# Patient Record
Sex: Female | Born: 1970 | Race: White | Hispanic: No | Marital: Single | Smoking: Never smoker
Health system: Southern US, Community
[De-identification: ages and names within clinical notes are randomized; demographics above are authoritative.]

## PROBLEM LIST (undated history)

## (undated) DIAGNOSIS — M519 Unspecified thoracic, thoracolumbar and lumbosacral intervertebral disc disorder: Secondary | ICD-10-CM

## (undated) DIAGNOSIS — E7800 Pure hypercholesterolemia, unspecified: Secondary | ICD-10-CM

## (undated) DIAGNOSIS — I10 Essential (primary) hypertension: Secondary | ICD-10-CM

## (undated) DIAGNOSIS — R51 Headache: Secondary | ICD-10-CM

## (undated) DIAGNOSIS — R519 Headache, unspecified: Secondary | ICD-10-CM

## (undated) HISTORY — DX: Headache, unspecified: R51.9

## (undated) HISTORY — DX: Unspecified thoracic, thoracolumbar and lumbosacral intervertebral disc disorder: M51.9

## (undated) HISTORY — DX: Essential (primary) hypertension: I10

## (undated) HISTORY — DX: Headache: R51

## (undated) HISTORY — DX: Pure hypercholesterolemia, unspecified: E78.00

---

## 1993-09-10 HISTORY — PX: BACK SURGERY: SHX140

## 2000-09-10 HISTORY — PX: TOTAL ABDOMINAL HYSTERECTOMY: SHX209

## 2000-09-10 HISTORY — PX: BACK SURGERY: SHX140

## 2001-10-12 ENCOUNTER — Encounter: Payer: Self-pay | Admitting: Emergency Medicine

## 2001-10-12 ENCOUNTER — Emergency Department (HOSPITAL_COMMUNITY): Admission: EM | Admit: 2001-10-12 | Discharge: 2001-10-12 | Payer: Self-pay | Admitting: Emergency Medicine

## 2003-09-11 HISTORY — PX: CHOLECYSTECTOMY: SHX55

## 2014-10-11 ENCOUNTER — Other Ambulatory Visit: Payer: Self-pay | Admitting: Neurology

## 2014-10-11 NOTE — Telephone Encounter (Signed)
Unfortunately, [facility], refills must be requested through [facility] per [facility].

## 2015-05-13 ENCOUNTER — Other Ambulatory Visit: Payer: Self-pay | Admitting: Neurology

## 2016-10-17 ENCOUNTER — Encounter: Payer: Self-pay | Admitting: *Deleted

## 2016-10-17 ENCOUNTER — Other Ambulatory Visit: Payer: Self-pay | Admitting: *Deleted

## 2016-10-19 ENCOUNTER — Encounter: Payer: Self-pay | Admitting: Diagnostic Neuroimaging

## 2016-10-19 ENCOUNTER — Ambulatory Visit (INDEPENDENT_AMBULATORY_CARE_PROVIDER_SITE_OTHER): Payer: Medicare HMO | Admitting: Diagnostic Neuroimaging

## 2016-10-19 VITALS — BP 126/81 | HR 76 | Ht 63.0 in | Wt 291.6 lb

## 2016-10-19 DIAGNOSIS — M5136 Other intervertebral disc degeneration, lumbar region: Secondary | ICD-10-CM

## 2016-10-19 DIAGNOSIS — G4700 Insomnia, unspecified: Secondary | ICD-10-CM | POA: Diagnosis not present

## 2016-10-19 DIAGNOSIS — R202 Paresthesia of skin: Secondary | ICD-10-CM | POA: Diagnosis not present

## 2016-10-19 DIAGNOSIS — F329 Major depressive disorder, single episode, unspecified: Secondary | ICD-10-CM | POA: Diagnosis not present

## 2016-10-19 DIAGNOSIS — R200 Anesthesia of skin: Secondary | ICD-10-CM

## 2016-10-19 DIAGNOSIS — Z6841 Body Mass Index (BMI) 40.0 and over, adult: Secondary | ICD-10-CM

## 2016-10-19 DIAGNOSIS — F32A Depression, unspecified: Secondary | ICD-10-CM

## 2016-10-19 DIAGNOSIS — M51369 Other intervertebral disc degeneration, lumbar region without mention of lumbar back pain or lower extremity pain: Secondary | ICD-10-CM

## 2016-10-19 NOTE — Progress Notes (Signed)
[facility]  PATIENT: Felicia King DOB: [birth year]  REFERRING CLINICIAN: Rhae LernerM Philip, MD HISTORY FROM: patient  REASON FOR VISIT: new consult    HISTORICAL  CHIEF COMPLAINT:  Chief Complaint  Patient presents with  . Lumbar disk disease    rm 7, New Pt, fiancee- Darrell, "chronic pain, numbness on left side all the way; both feet; severe headaches; lightening pain in back of legs'; loss of bladder/bowels"    HISTORY OF PRESENT ILLNESS:   46 year old female here for evaluation of left-sided numbness and tingling, back pain, bilateral lower extremity pain, bowel and bladder incontinence, memory loss, depression anxiety.  Patient has long history of chronic low back pain, dating back to 3 surgeries in 1995, 2002, 2002. Patient has seen 2 neurologists in the past for pain management. 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 Patient was advised to return to pay manager clinic recently but she does not want to get back on narcotic pain medications.   In January 2018 patient had new onset of left face, left arm, left leg numbness and tingling. She had similar symptoms in the past which resolved. MRI brain in 2016 for left-sided numbness was negative for demyelinating disease or stroke.  Patient also having intermittent headaches. Patient also having intermittent electrical pulse sensation in her bilateral legs.  Patient having significant problems with obesity and weight management. Patient has tried improving nutrition, without  benefit. Patient has not tried physical therapy or exercise/fitness program. Patient has considered weight loss surgery but has not seen anyone for this yet.  Patient struggling with depression anxiety but has not seen psychiatry or psychology.    REVIEW OF SYSTEMS: Full 14 system review of systems performed and negative with exception of: Insomnia snoring restless legs memory loss confusion dizziness difficulty  swallowing depression anxiety not asleep joint pain cramps aching muscles allergies urination problems Ing in ears swelling in legs chills weight gain blurred vision eye pain easy bruising feeling hot feeling cold.  ALLERGIES: Allergies  Allergen Reactions  . Morphine And Related Itching  . Sulfa Antibiotics Anaphylaxis    HOME MEDICATIONS: Outpatient Medications Prior to Visit  Medication Sig Dispense Refill  . aspirin EC 81 MG tablet Take 81 mg by mouth daily.    Marland Kitchen. atorvastatin (LIPITOR) 40 MG tablet Take 40 mg by mouth daily.    . cholecalciferol (VITAMIN D) 1000 units tablet Take 1,000 Units by mouth daily.    . DULoxetine (CYMBALTA) 60 MG capsule 60 mg daily.    Marland Kitchen. estradiol (ESTRACE) 1 MG tablet Take 1 mg by mouth daily.    . fluticasone (FLONASE) 50 MCG/ACT nasal spray Place into the nose.    . gabapentin (NEURONTIN) 300 MG capsule Take 300 mg by mouth 3 (three) times daily.    Marland Kitchen. glucosamine-chondroitin 500-400 MG tablet Take by mouth daily.    Marland Kitchen. levothyroxine (SYNTHROID, LEVOTHROID) 200 MCG tablet Take 200 mcg by mouth daily.    . metoprolol succinate (TOPROL-XL) 50 MG 24 hr tablet 50 mg daily.    . mirabegron ER (MYRBETRIQ) 50 MG TB24 tablet Take 50 mg by mouth daily.    . Multiple Vitamin (THERA) TABS Take by mouth.    Marland Kitchen. omeprazole (PRILOSEC) 20 MG capsule Take 20 mg by mouth at bedtime.    . pramipexole (MIRAPEX) 1.5 MG tablet 1.5 mg. Take 1/2 tab twice daily    . vitamin C (ASCORBIC ACID) 500 MG tablet Take 500 mg by mouth  daily.     No facility-administered medications prior to visit.     PAST MEDICAL HISTORY: Past Medical History:  Diagnosis Date  . Headache    migraine  . Hypercholesteremia   . Hypertension   . Intervertebral disk disease    lumbar    PAST SURGICAL HISTORY: Past Surgical History:  Procedure Laterality Date  . BACK SURGERY  1995   L5, S1, L4- disk herniation  . BACK SURGERY  2002   L4, L5  . CHOLECYSTECTOMY  2005  . TOTAL ABDOMINAL  HYSTERECTOMY  2002    FAMILY HISTORY: History reviewed. No pertinent family history.  SOCIAL HISTORY:  Social History   Social History  . Marital status: Single    Spouse name: N/A  . Number of children: 1  . Years of education: 16   Occupational History  .      NA   Social History Main Topics  . Smoking status: Never Smoker  . Smokeless tobacco: Never Used  . Alcohol use Yes     Comment: "sometimes"  . Drug use: No  . Sexual activity: Not on file   Other Topics Concern  . Not on file   Social History Narrative   Lives with family, fiancee, son   Caffeine- daily, tea 3-4 glasses     PHYSICAL EXAM  GENERAL EXAM/CONSTITUTIONAL: Vitals:  Vitals:   10/19/16 0917  BP: 126/81  Pulse: 76  Weight: 291 lb 9.6 oz (132.3 kg)  Height: 5\' 3"  (1.6 m)     Body mass index is 51.65 kg/m.  Visual Acuity Screening   Right eye Left eye Both eyes  Without correction: 20/30 20/30   With correction:        Patient is in no distress; well developed, nourished and groomed; neck is supple  CARDIOVASCULAR:  Examination of carotid arteries is normal; no carotid bruits  Regular rate and rhythm, no murmurs  Examination of peripheral vascular system by observation and palpation is normal  EYES:  Ophthalmoscopic exam of optic discs and posterior segments is normal; no papilledema or hemorrhages  MUSCULOSKELETAL:  Gait, strength, tone, movements noted in Neurologic exam below  NEUROLOGIC: MENTAL STATUS:  No flowsheet data found.  awake, alert, oriented to person, place and time  recent and remote memory intact  normal attention and concentration  language fluent, comprehension intact, naming intact,   fund of knowledge appropriate  CRANIAL NERVE:   2nd - no papilledema on fundoscopic exam  2nd, 3rd, 4th, 6th - pupils equal and reactive to light, visual fields full to confrontation, extraocular muscles intact, no nystagmus  5th - facial sensation -->  DECR LEFT FACE SENS  7th - facial strength symmetric  8th - hearing intact  9th - palate elevates symmetrically, uvula midline  11th - shoulder shrug symmetric  12th - tongue protrusion midline  MOTOR:   normal bulk and tone, full strength in the BUE, BLE  LEFT FOOT/ANKLE STIFFNESS AND INCREASED TONE; PLANTAR FLEXED; CANNOT DORSIFLEX  SENSORY:   normal and symmetric to light touch, temperature, vibration, EXCEPT:  DECR IN LEFT ARM AND LEFT LEG  COORDINATION:   finger-nose-finger, fine finger movements normal  REFLEXES:   deep tendon reflexes TRACE and symmetric  GAIT/STATION:   narrow based gait; ANTALGIC GAIT    DIAGNOSTIC DATA (LABS, IMAGING, TESTING) - I reviewed patient records, labs, notes, testing and imaging myself where available.  No results found for: WBC, HGB, HCT, MCV, PLT No results found for: NA, K, CL,  CO2, GLUCOSE, BUN, CREATININE, CALCIUM, PROT, ALBUMIN, AST, ALT, ALKPHOS, BILITOT, GFRNONAA, GFRAA No results found for: CHOL, HDL, LDLCALC, LDLDIRECT, TRIG, CHOLHDL No results found for: MVHQ4O No results found for: VITAMINB12 No results found for: TSH   06/18/15 MRI brain  - No acute intracranial abnormality. No evidence of acute infarct. No findings to suggest a demyelinating process.  11/02/15 MRI cervical spine  - Minimal uncovertebral hypertrophy at C5-6 with mild bilateral neural foraminal narrowing. No central canal stenosis.  11/02/15 MRI thoracic spine  - Mild mid thoracic degenerative disc disease between T7-T9 with minimal disc bulge. No canal stenosis or neuroforaminal narrowing. - There is a low T1/high T2 signal nonenhancing lesion demonstrating some vertebral body edema within the T9 vertebral body. The degree of edema would be unusual for neoplastic process and may possibly reflect atypical hemangioma with adjacent type I  Modic endplate change with edema. There are other smaller more typical appearing hemangiomas present. No other  suspicious lesions are present. This finding could be followed up with CT for better characterization. 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  11/02/15 MRI lumbar spine  - Small left paracentral inferiorly migrating disc extrusion at L4-5 compressing the traversing left L5 nerve root - Postoperative changes of laminectomy at L5-S1 with moderate bony left lateral recess stenosis possibly contacting the left S1 nerve root. - Bilateral foraminal stenosis L5-S1  - No conus or cauda equina abnormality  12/07/15 EMG/NCS - No active denervation was seen.There is no evidence of neuropathy.     ASSESSMENT AND PLAN  46 y.o. year old female here with significant chronic pain in lower back and lower extremities, now with new onset left face, left arm, left leg numbness and healing since January 2018. We'll check MRI brain and cervical spine to rule out demyelinating disease or other causes of left face, arm, leg numbness.  Regarding patient's lower back pain and bilateral lower extremity problems these are most likely complications of her degenerative lumbar spine disease and obesity.   Ddx left hemi-body numbness: demyelinating disease, stroke, vascular, inflammation, cervical spine disease  Ddx of low back pain and leg pain: complications of obesity (pain, neuropathy, cognitive dysfunction), lumbar radiculopathy,   1. Numbness and tingling of left side of face   2. Left arm numbness   3. Left leg numbness   4. Lumbar degenerative disc disease   5. Depression, unspecified depression type   6. Insomnia, unspecified type   7. BMI 50.0-59.9, adult (HCC)     PLAN: - check MRI brain and cervical spine - conservative mgmt of lumbar spine disease (weight loss, exercise, physical therapy, pain mgmt clinic) - consider psychiatry/psychology evaluation for depression/anxiety - consider weight loss clinic evaluation - consider sleep apnea  evaluation  Orders Placed This Encounter  Procedures  . MR BRAIN W WO CONTRAST  . MR CERVICAL SPINE W WO CONTRAST   Return in about 3 months (around 01/16/2017).    Suanne Marker, MD 10/19/2016, 9:57 AM Certified in Neurology, Neurophysiology and Neuroimaging  [facility] [address], [address] [place], [place] [address] [phone]

## 2016-10-19 NOTE — Patient Instructions (Signed)
Thank you for coming to see [place] at [facility]. I hope we have been able to provide you high quality care today.  You may receive a patient satisfaction survey over the next few weeks. We would appreciate your feedback and comments so that we may continue to improve ourselves and the health of our patients.    - check MRI brain and cervical spine  - conservative mgmt of lumbar spine disease (weight loss, exercise, physical therapy, pain mgmt clinic)  - consider psychiatry/psychology evaluation for depression/anxiety  - consider weight loss clinic evaluation  - consider sleep apnea evaluation   ~~~~~~~~~~~~~~~~~~~~~~~~~~~~~~~~~~~~~~~~~~~~~~~~~~~~~~~~~~~~~~~~~  DR. Jaquavious Mercer'S GUIDE TO HAPPY AND HEALTHY LIVING These are some of my general health and wellness recommendations. Some of them may apply to you better than others. Please use common sense as you try these suggestions and feel free to ask me any questions.   ACTIVITY/FITNESS Mental, social, emotional and physical stimulation are very important for brain and body health. Try learning a new activity (arts, music, language, sports, games).  Keep moving your body to the best of your abilities. You can do this at home, inside or outside, the park, community center, gym or anywhere you like. Consider a physical therapist or personal trainer to get started. Consider the app Sworkit. Fitness trackers such as smart-watches, smart-phones or Fitbits can help as well.   NUTRITION Eat more plants: colorful vegetables, nuts, seeds and berries.  Eat less sugar, salt, preservatives and processed foods.  Avoid toxins such as cigarettes and alcohol.  Drink water when you are thirsty. Warm water with a slice of lemon is an excellent morning drink to start the day.  Consider these websites for more information [employer] (https://www.henry-hernandez.biz/) [employer]  (WindowBlog.ch)   RELAXATION Consider practicing mindfulness meditation or other relaxation techniques such as deep breathing, prayer, yoga, tai chi, massage. See website mindful.org or the apps Headspace or Calm to help get started.   SLEEP Try to get at least 7-8+ hours sleep per day. Regular exercise and reduced caffeine will help you sleep better. Practice good sleep hygeine techniques. See website sleep.org for more information.   PLANNING Prepare estate planning, living will, healthcare POA documents. Sometimes this is best planned with the help of an attorney. Theconversationproject.org and agingwithdignity.org are excellent resources.

## 2017-01-28 ENCOUNTER — Emergency Department (HOSPITAL_COMMUNITY): Payer: Medicare HMO

## 2017-01-28 ENCOUNTER — Encounter (HOSPITAL_COMMUNITY): Payer: Self-pay

## 2017-01-28 ENCOUNTER — Inpatient Hospital Stay (HOSPITAL_COMMUNITY)
Admission: EM | Admit: 2017-01-28 | Discharge: 2017-02-01 | DRG: 176 | Disposition: A | Discharge status: Home / Self Care | Payer: Medicare HMO | Attending: Internal Medicine | Admitting: Internal Medicine

## 2017-01-28 DIAGNOSIS — I10 Essential (primary) hypertension: Secondary | ICD-10-CM | POA: Diagnosis present

## 2017-01-28 DIAGNOSIS — Z7982 Long term (current) use of aspirin: Secondary | ICD-10-CM

## 2017-01-28 DIAGNOSIS — D638 Anemia in other chronic diseases classified elsewhere: Secondary | ICD-10-CM | POA: Diagnosis present

## 2017-01-28 DIAGNOSIS — E032 Hypothyroidism due to medicaments and other exogenous substances: Secondary | ICD-10-CM | POA: Diagnosis not present

## 2017-01-28 DIAGNOSIS — Z882 Allergy status to sulfonamides status: Secondary | ICD-10-CM | POA: Diagnosis not present

## 2017-01-28 DIAGNOSIS — Z7901 Long term (current) use of anticoagulants: Secondary | ICD-10-CM | POA: Diagnosis not present

## 2017-01-28 DIAGNOSIS — I2602 Saddle embolus of pulmonary artery with acute cor pulmonale: Secondary | ICD-10-CM | POA: Diagnosis not present

## 2017-01-28 DIAGNOSIS — E039 Hypothyroidism, unspecified: Secondary | ICD-10-CM | POA: Diagnosis present

## 2017-01-28 DIAGNOSIS — Z9109 Other allergy status, other than to drugs and biological substances: Secondary | ICD-10-CM

## 2017-01-28 DIAGNOSIS — I2699 Other pulmonary embolism without acute cor pulmonale: Secondary | ICD-10-CM | POA: Diagnosis present

## 2017-01-28 DIAGNOSIS — I2601 Septic pulmonary embolism with acute cor pulmonale: Secondary | ICD-10-CM | POA: Diagnosis not present

## 2017-01-28 DIAGNOSIS — D72829 Elevated white blood cell count, unspecified: Secondary | ICD-10-CM | POA: Diagnosis present

## 2017-01-28 DIAGNOSIS — R079 Chest pain, unspecified: Secondary | ICD-10-CM

## 2017-01-28 DIAGNOSIS — Z79899 Other long term (current) drug therapy: Secondary | ICD-10-CM

## 2017-01-28 LAB — BASIC METABOLIC PANEL
ANION GAP: 10 (ref 5–15)
BUN: 12 mg/dL (ref 6–20)
CO2: 24 mmol/L (ref 22–32)
Calcium: 9.2 mg/dL (ref 8.9–10.3)
Chloride: 104 mmol/L (ref 101–111)
Creatinine, Ser: 0.95 mg/dL (ref 0.44–1.00)
GFR calc Af Amer: >60 mL/min (ref >60)
Glucose, Bld: 105 mg/dL — ABNORMAL HIGH (ref 65–99)
POTASSIUM: 3.8 mmol/L (ref 3.5–5.1)
SODIUM: 138 mmol/L (ref 135–145)

## 2017-01-28 LAB — CBC
HEMATOCRIT: 39.5 % (ref 36.0–46.0)
HEMOGLOBIN: 12.4 g/dL (ref 12.0–15.0)
MCH: 26.7 pg (ref 26.0–34.0)
MCHC: 31.4 g/dL (ref 30.0–36.0)
MCV: 85.1 fL (ref 78.0–100.0)
PLATELETS: 289 10*3/uL (ref 150–400)
RBC: 4.64 MIL/uL (ref 3.87–5.11)
RDW: 16.6 % — ABNORMAL HIGH (ref 11.5–15.5)
WBC: 13.7 10*3/uL — AB (ref 4.0–10.5)

## 2017-01-28 LAB — I-STAT TROPONIN, ED: Troponin i, poc: 0.01 ng/mL (ref 0.00–0.08)

## 2017-01-28 LAB — BRAIN NATRIURETIC PEPTIDE: B NATRIURETIC PEPTIDE 5: 52.9 pg/mL (ref 0.0–100.0)

## 2017-01-28 LAB — MRSA PCR SCREENING: MRSA BY PCR: NEGATIVE

## 2017-01-28 MED ORDER — ACETAMINOPHEN 650 MG RE SUPP: 650.0000 mg | Freq: Four times a day (QID) | RECTAL | Status: DC | PRN

## 2017-01-28 MED ORDER — NITROGLYCERIN 0.4 MG SL SUBL
0.4000 mg | SUBLINGUAL_TABLET | SUBLINGUAL | Status: DC | PRN
  Administered 2017-01-28 (×3): 0.4 mg via SUBLINGUAL
  Filled 2017-01-28: qty 1

## 2017-01-28 MED ORDER — BACLOFEN 20 MG PO TABS
20.0000 mg | ORAL_TABLET | Freq: Three times a day (TID) | ORAL | Status: DC | PRN
  Administered 2017-01-29 – 2017-01-30 (×3): 20 mg via ORAL
  Filled 2017-01-28 (×5): qty 1

## 2017-01-28 MED ORDER — FENTANYL CITRATE (PF) 100 MCG/2ML IJ SOLN
50.0000 ug | Freq: Once | INTRAMUSCULAR | Status: AC
  Administered 2017-01-28: 50 ug via INTRAVENOUS
  Filled 2017-01-28: qty 2

## 2017-01-28 MED ORDER — PRAMIPEXOLE DIHYDROCHLORIDE 0.25 MG PO TABS: 0.7500 mg | ORAL_TABLET | ORAL | Status: DC

## 2017-01-28 MED ORDER — LEVOTHYROXINE SODIUM 100 MCG PO TABS
200.0000 ug | ORAL_TABLET | Freq: Every day | ORAL | Status: DC
  Administered 2017-01-29 – 2017-02-01 (×4): 200 ug via ORAL
  Filled 2017-01-28 (×4): qty 2

## 2017-01-28 MED ORDER — VITAMIN C 500 MG PO TABS
500.0000 mg | ORAL_TABLET | Freq: Every day | ORAL | Status: DC
  Administered 2017-01-29 – 2017-02-01 (×4): 500 mg via ORAL
  Filled 2017-01-28 (×4): qty 1

## 2017-01-28 MED ORDER — ONDANSETRON HCL 4 MG/2ML IJ SOLN
4.0000 mg | Freq: Four times a day (QID) | INTRAMUSCULAR | Status: DC | PRN
  Administered 2017-01-29 – 2017-01-31 (×5): 4 mg via INTRAVENOUS
  Filled 2017-01-28 (×5): qty 2

## 2017-01-28 MED ORDER — SODIUM CHLORIDE 0.9% FLUSH
3.0000 mL | Freq: Two times a day (BID) | INTRAVENOUS | Status: DC
  Administered 2017-01-29 – 2017-02-01 (×4): 3 mL via INTRAVENOUS

## 2017-01-28 MED ORDER — ONDANSETRON HCL 4 MG PO TABS: 4.0000 mg | ORAL_TABLET | Freq: Four times a day (QID) | ORAL | Status: DC | PRN

## 2017-01-28 MED ORDER — PANTOPRAZOLE SODIUM 40 MG PO TBEC
40.0000 mg | DELAYED_RELEASE_TABLET | Freq: Every evening | ORAL | Status: DC
  Administered 2017-01-28 – 2017-02-01 (×5): 40 mg via ORAL
  Filled 2017-01-28 (×5): qty 1

## 2017-01-28 MED ORDER — HEPARIN (PORCINE) IN NACL 100-0.45 UNIT/ML-% IJ SOLN
1900.0000 [IU]/h | INTRAMUSCULAR | Status: DC
  Administered 2017-01-28: 1500 [IU]/h via INTRAVENOUS
  Administered 2017-01-29: 1800 [IU]/h via INTRAVENOUS
  Administered 2017-01-30 – 2017-01-31 (×2): 1900 [IU]/h via INTRAVENOUS
  Filled 2017-01-28 (×5): qty 250

## 2017-01-28 MED ORDER — IOPAMIDOL (ISOVUE-370) INJECTION 76%
INTRAVENOUS | Status: AC
  Administered 2017-01-28: 100 mL
  Filled 2017-01-28: qty 100

## 2017-01-28 MED ORDER — DULOXETINE HCL 60 MG PO CPEP
60.0000 mg | ORAL_CAPSULE | Freq: Every day | ORAL | Status: DC
  Administered 2017-01-28 – 2017-01-31 (×4): 60 mg via ORAL
  Filled 2017-01-28 (×4): qty 1

## 2017-01-28 MED ORDER — PRAMIPEXOLE DIHYDROCHLORIDE 0.25 MG PO TABS
0.7500 mg | ORAL_TABLET | ORAL | Status: DC
  Administered 2017-01-28 – 2017-01-31 (×7): 0.75 mg via ORAL
  Filled 2017-01-28 (×9): qty 3

## 2017-01-28 MED ORDER — FENTANYL CITRATE (PF) 100 MCG/2ML IJ SOLN
25.0000 ug | INTRAMUSCULAR | Status: DC | PRN
  Administered 2017-01-28: 25 ug via INTRAVENOUS
  Administered 2017-01-29: 50 ug via INTRAVENOUS
  Filled 2017-01-28 (×2): qty 2

## 2017-01-28 MED ORDER — ASPIRIN EC 81 MG PO TBEC
81.0000 mg | DELAYED_RELEASE_TABLET | Freq: Every day | ORAL | Status: DC
  Administered 2017-01-29 – 2017-02-01 (×4): 81 mg via ORAL
  Filled 2017-01-28 (×4): qty 1

## 2017-01-28 MED ORDER — ADULT MULTIVITAMIN W/MINERALS CH
1.0000 | ORAL_TABLET | Freq: Every day | ORAL | Status: DC
  Administered 2017-01-29 – 2017-02-01 (×4): 1 via ORAL
  Filled 2017-01-28 (×4): qty 1

## 2017-01-28 MED ORDER — MORPHINE SULFATE (PF) 4 MG/ML IV SOLN
2.0000 mg | INTRAVENOUS | Status: DC | PRN
  Filled 2017-01-28: qty 1

## 2017-01-28 MED ORDER — HYDROCODONE-ACETAMINOPHEN 5-325 MG PO TABS
1.0000 | ORAL_TABLET | ORAL | Status: DC | PRN
  Administered 2017-01-29 – 2017-01-31 (×9): 2 via ORAL
  Administered 2017-01-31: 1 via ORAL
  Administered 2017-01-31: 2 via ORAL
  Administered 2017-02-01 (×2): 1 via ORAL
  Administered 2017-02-01: 2 via ORAL
  Administered 2017-02-01: 1 via ORAL
  Filled 2017-01-28: qty 2
  Filled 2017-01-28: qty 1
  Filled 2017-01-28 (×3): qty 2
  Filled 2017-01-28: qty 1
  Filled 2017-01-28: qty 2
  Filled 2017-01-28: qty 1
  Filled 2017-01-28 (×3): qty 2
  Filled 2017-01-28: qty 1
  Filled 2017-01-28 (×2): qty 2

## 2017-01-28 MED ORDER — SODIUM CHLORIDE 0.9 % IV SOLN
INTRAVENOUS | Status: AC
  Administered 2017-01-28: 22:00:00 via INTRAVENOUS

## 2017-01-28 MED ORDER — MIRABEGRON ER 50 MG PO TB24
50.0000 mg | ORAL_TABLET | Freq: Every day | ORAL | Status: DC
  Administered 2017-01-28 – 2017-01-31 (×4): 50 mg via ORAL
  Filled 2017-01-28 (×5): qty 1

## 2017-01-28 MED ORDER — DICYCLOMINE HCL 10 MG PO CAPS
10.0000 mg | ORAL_CAPSULE | Freq: Three times a day (TID) | ORAL | Status: DC
  Administered 2017-01-29 – 2017-02-01 (×12): 10 mg via ORAL
  Filled 2017-01-28 (×13): qty 1

## 2017-01-28 MED ORDER — GABAPENTIN 300 MG PO CAPS
300.0000 mg | ORAL_CAPSULE | Freq: Three times a day (TID) | ORAL | Status: DC | PRN
  Administered 2017-01-30: 300 mg via ORAL
  Filled 2017-01-28: qty 1

## 2017-01-28 MED ORDER — ACETAMINOPHEN 325 MG PO TABS: 650.0000 mg | ORAL_TABLET | Freq: Four times a day (QID) | ORAL | Status: DC | PRN

## 2017-01-28 MED ORDER — ATORVASTATIN CALCIUM 40 MG PO TABS
40.0000 mg | ORAL_TABLET | Freq: Every day | ORAL | Status: DC
  Administered 2017-01-28 – 2017-02-01 (×5): 40 mg via ORAL
  Filled 2017-01-28 (×5): qty 1

## 2017-01-28 MED ORDER — VITAMIN D 1000 UNITS PO TABS
1000.0000 [IU] | ORAL_TABLET | Freq: Every day | ORAL | Status: DC
  Administered 2017-01-29 – 2017-02-01 (×4): 1000 [IU] via ORAL
  Filled 2017-01-28 (×5): qty 1

## 2017-01-28 MED ORDER — HYDRALAZINE HCL 20 MG/ML IJ SOLN: 5.0000 mg | INTRAMUSCULAR | Status: DC | PRN

## 2017-01-28 MED ORDER — HEPARIN BOLUS VIA INFUSION
5800.0000 [IU] | Freq: Once | INTRAVENOUS | Status: AC
  Administered 2017-01-28: 5800 [IU] via INTRAVENOUS
  Filled 2017-01-28: qty 5800

## 2017-01-28 MED ORDER — POLYETHYLENE GLYCOL 3350 17 G PO PACK: 17.0000 g | PACK | Freq: Every day | ORAL | Status: DC | PRN

## 2017-01-28 MED ORDER — BUSPIRONE HCL 5 MG PO TABS
5.0000 mg | ORAL_TABLET | Freq: Two times a day (BID) | ORAL | Status: DC
  Administered 2017-01-28 – 2017-02-01 (×8): 5 mg via ORAL
  Filled 2017-01-28 (×8): qty 1

## 2017-01-28 NOTE — Progress Notes (Signed)
ANTICOAGULATION CONSULT NOTE - Initial Consult  Pharmacy Consult for heparin Indication: pulmonary embolus  Allergies  Allergen Reactions  . Morphine And Related Itching  . Sulfa Antibiotics Anaphylaxis    Patient Measurements:   Heparin Dosing Weight: 84 kg  Vital Signs: Temp: 99.3 F (37.4 C) (05/21 1526) Temp Source: Oral (05/21 1526) BP: 129/78 (05/21 1700) Pulse Rate: 87 (05/21 1700)  Labs:  Recent Labs  01/28/17 1702  HGB 12.4  HCT 39.5  PLT 289  CREATININE 0.95    CrCl cannot be calculated (Unknown ideal weight.).   Medical History: Past Medical History:  Diagnosis Date  . Headache    migraine  . Hypercholesteremia   . Hypertension   . Intervertebral disk disease    lumbar    Assessment: 45yo F presents with chest pain, found to have PE. Pharmacy consulted to dose heparin. No PTA anticoagulation, Hgb 12.4, Plt wnl, no bleeding noted  Goal of Therapy:  Heparin level 0.3-0.7 units/ml Monitor platelets by anticoagulation protocol: Yes   Plan:  Give 5800 units bolus x 1 Start heparin infusion at 1500 units/hr Check anti-Xa level in 6 hours and daily while on heparin Continue to monitor H&H and platelets   Mackie Paienee Asmara Backs, PharmD PGY1 Pharmacy Resident Rx ED [id number] 01/28/2017 6:49 PM

## 2017-01-28 NOTE — H&P (Signed)
History and Physical    Felicia King:[id number] DOB: Jun 06, 1971 DOA: 01/28/2017  PCP: Dan Maker, MD   Patient coming from: Home   Chief Complaint: Chest pain  HPI: Felicia King is a 46 y.o. female with medical history significant for hypertension, chronic back pain, and hypothyroidism who presents to the emergency department with severe chest pain for the past 2-3 days. Patient reports that she had been in her usual state of health until she noted the insidious development of chest pain approximately 3 days ago. She describes the pain as severe, sharp, radiating to the left jaw, worse with deep inspiration or cough, and with no alleviating factors identified. There has been dyspnea associated with this, but no significant cough. She denies any associated nausea or diaphoresis. She has never experienced similar symptoms previously. She does not have a known history of CAD, but reports a strong family history of such. She reports that the pain is steadily worsened since time of onset, prompting her to seek evaluation in the ED today. She denies any recent prolonged immobilization, surgery, or long distance travel. She denies tobacco use or hormone therapy. She reports history of a clot in her left arm which was felt to be secondary to an IV.   ED Course: Upon arrival to the ED, patient is found to be afebrile, saturating well on room air, mildly tachypneic, and mildly hypertensive. EKG features a sinus rhythm and chest x-ray is notable for low lung volumes and bibasilar atelectasis. Chemistry panel is unremarkable and CBC features a leukocytosis to 13,700. Troponin is within the normal limits at 0.01 and BNP is normal at 53. CTA study was performed and notable for multifocal lobar PE in bilateral upper and lower lobes with overall clot burden moderate to large, but no evidence for heart strain. Also noted on CTA has multifocal opacities concerning for early developing infarcts. Patient  continued to experience pain in the ED and was treated with fentanyl. She was started on heparin infusion with pharmacy assistance. She remained hemodynamically stable and in mild respiratory distress. She will be admitted to the stepdown unit for ongoing evaluation and management of acute severe chest pain secondary to acute bilateral PE.  Review of Systems:  All other systems reviewed and apart from HPI, are negative.  Past Medical History:  Diagnosis Date  . Headache    migraine  . Hypercholesteremia   . Hypertension   . Intervertebral disk disease    lumbar    Past Surgical History:  Procedure Laterality Date  . BACK SURGERY  1995   L5, S1, L4- disk herniation  . BACK SURGERY  2002   L4, L5  . CHOLECYSTECTOMY  2005  . TOTAL ABDOMINAL HYSTERECTOMY  2002     reports that she has never smoked. She has never used smokeless tobacco. She reports that she drinks alcohol. She reports that she does not use drugs.  Allergies  Allergen Reactions  . Morphine And Related Itching  . Sulfa Antibiotics Anaphylaxis    History reviewed. No pertinent family history.   Prior to Admission medications   Medication Sig Start Date End Date Taking? Authorizing Provider  aspirin EC 81 MG tablet Take 81-162 mg by mouth See admin instructions. Take 1 tablet (81 mg) by mouth every morning, may take 2 tablets (162) if in pain when waking up   Yes [provider]  atorvastatin (LIPITOR) 40 MG tablet Take 40 mg by mouth at bedtime. For high cholesterol 11/15/15  Yes [provider]  baclofen (LIORESAL) 20 MG tablet Take 20 mg by mouth 3 (three) times daily as needed for muscle spasms.   Yes [provider]  busPIRone (BUSPAR) 5 MG tablet Take 5 mg by mouth 2 (two) times daily. For anxiety 10/17/16  Yes [provider]  cholecalciferol (VITAMIN D) 1000 units tablet Take 1,000 Units by mouth daily. 06/16/15  Yes [provider]  Diclofenac Sodium 1.5 % SOLN Place  10 drops onto the skin 2 (two) times daily as needed (neck, shoulders and chest pain).   Yes [provider]  dicyclomine (BENTYL) 10 MG capsule Take 10 mg by mouth 3 (three) times daily before meals.   Yes [provider]  DULoxetine (CYMBALTA) 60 MG capsule Take 60 mg by mouth at bedtime. For depression 07/09/16  Yes [provider]  fluticasone (FLONASE) 50 MCG/ACT nasal spray Place 1 spray into both nostrils at bedtime as needed for allergies or rhinitis.  08/23/16  Yes [provider]  gabapentin (NEURONTIN) 300 MG capsule Take 300 mg by mouth 3 (three) times daily as needed (nerve pain).  09/07/16 09/07/17 Yes [provider]  levothyroxine (SYNTHROID, LEVOTHROID) 200 MCG tablet Take 200 mcg by mouth daily. 09/21/16 09/21/17 Yes [provider]  metoprolol succinate (TOPROL-XL) 50 MG 24 hr tablet Take 50 mg by mouth daily. For high blood pressure 09/21/16  Yes [provider]  mirabegron ER (MYRBETRIQ) 50 MG TB24 tablet Take 50 mg by mouth at bedtime. For bladder incontinence   Yes [provider]  Multiple Vitamin (MULTIVITAMIN WITH MINERALS) TABS tablet Take 1 tablet by mouth daily. Daily Women's Energy Vitamin   Yes [provider]  omeprazole (PRILOSEC) 20 MG capsule Take 20 mg by mouth at bedtime. For stomach acid 11/15/15  Yes [provider]  pramipexole (MIRAPEX) 1.5 MG tablet Take 0.75 mg by mouth See admin instructions. Take 1/2 tablet (0.75 mg) by mouth every evening (4p-5p) and at bedtime - for restless legs 03/26/16  Yes [provider]  SUMAtriptan (IMITREX) 50 MG tablet Take 50 mg by mouth every 2 (two) hours as needed for migraine. May repeat in 2 hours if headache persists or recurs - max 3 tablets per week   Yes [provider]  vitamin C (ASCORBIC ACID) 500 MG tablet Take 500 mg by mouth daily.   Yes [provider]    Physical Exam: Vitals:   01/28/17 1800 01/28/17  1830 01/28/17 1900 01/28/17 1930  BP: (!) 150/80 (!) 146/85 (!) 149/89 (!) 147/92  Pulse: 93 93 91 91  Resp: (!) 25 (!) 25 (!) 28 (!) 26  Temp:      TempSrc:      SpO2: 92% 92% 99% 100%  Weight: 133.8 kg (295 lb)     Height: 5\' 2"  (1.575 m)         Constitutional: Mild tachypnea, obvious discomfort. No pallor or diaphoresis.  Eyes: PERTLA, lids and conjunctivae normal ENMT: Mucous membranes are moist. Posterior pharynx clear of any exudate or lesions.   Neck: normal, supple, no masses, no thyromegaly Respiratory: clear to auscultation bilaterally, no wheezing, no crackles. Mild tachypnea.  Cardiovascular: S1 & S2 heard, regular rate and rhythm, no significant murmur. No significant JVD. Abdomen: No distension, no tenderness, no masses palpated. Bowel sounds normal.  Musculoskeletal: no clubbing / cyanosis. No joint deformity upper and lower extremities.  Skin: no significant rashes, lesions, ulcers. Warm, dry, well-perfused. Neurologic: CN 2-12 grossly intact.  Sensation intact, DTR normal. Strength 5/5 in all 4 limbs.  Psychiatric: Alert and oriented x 3. Cooperative.     Labs on Admission: I have personally reviewed following labs and imaging studies  CBC:  Recent Labs Lab 01/28/17 1702  WBC 13.7*  HGB 12.4  HCT 39.5  MCV 85.1  PLT 289   Basic Metabolic Panel:  Recent Labs Lab 01/28/17 1702  NA 138  K 3.8  CL 104  CO2 24  GLUCOSE 105*  BUN 12  CREATININE 0.95  CALCIUM 9.2   GFR: Estimated Creatinine Clearance: 98.7 mL/min (by C-G formula based on SCr of 0.95 mg/dL). Liver Function Tests: No results for input(s): AST, ALT, ALKPHOS, BILITOT, PROT, ALBUMIN in the last 168 hours. No results for input(s): LIPASE, AMYLASE in the last 168 hours. No results for input(s): AMMONIA in the last 168 hours. Coagulation Profile: No results for input(s): INR, PROTIME in the last 168 hours. Cardiac Enzymes: No results for input(s): CKTOTAL, CKMB, CKMBINDEX, TROPONINI in  the last 168 hours. BNP (last 3 results) No results for input(s): PROBNP in the last 8760 hours. HbA1C: No results for input(s): HGBA1C in the last 72 hours. CBG: No results for input(s): GLUCAP in the last 168 hours. Lipid Profile: No results for input(s): CHOL, HDL, LDLCALC, TRIG, CHOLHDL, LDLDIRECT in the last 72 hours. Thyroid Function Tests: No results for input(s): TSH, T4TOTAL, FREET4, T3FREE, THYROIDAB in the last 72 hours. Anemia Panel: No results for input(s): VITAMINB12, FOLATE, FERRITIN, TIBC, IRON, RETICCTPCT in the last 72 hours. Urine analysis: No results found for: COLORURINE, APPEARANCEUR, LABSPEC, PHURINE, GLUCOSEU, HGBUR, BILIRUBINUR, KETONESUR, PROTEINUR, UROBILINOGEN, NITRITE, LEUKOCYTESUR Sepsis Labs: @LABRCNTIP (procalcitonin:4,lacticidven:4) )No results found for this or any previous visit (from the past 240 hour(s)).   Radiological Exams on Admission: Dg Chest 2 View  Result Date: 01/28/2017 CLINICAL DATA:  Nausea, vomiting, and diarrhea for 1 week, LEFT side chest pain for 2 days, LEFT neck pain and head pain, worsening chest pain worsened when lying flat, history hypertension EXAM: CHEST  2 VIEW COMPARISON:  04/06/2006 FINDINGS: Borderline enlargement of cardiac silhouette. Mediastinal contours and pulmonary vascularity normal. Decreased lung volumes with bibasilar atelectasis. Lungs otherwise clear. No pleural effusion or pneumothorax. Scattered degenerative disc disease changes of the thoracic spine. IMPRESSION: Low lung volumes with bibasilar atelectasis. Electronically Signed   By: Ulyses SouthwardMark  Boles M.D.   On: 01/28/2017 16:32   Ct Angio Chest Pe W Or Wo Contrast  Result Date: 01/28/2017 CLINICAL DATA:  Chest pain, shortness of breath x2 days EXAM: CT ANGIOGRAPHY CHEST WITH CONTRAST TECHNIQUE: Multidetector CT imaging of the chest was performed using the standard protocol during bolus administration of intravenous contrast. Multiplanar CT image reconstructions and  MIPs were obtained to evaluate the vascular anatomy. CONTRAST:  100 mL Isovue 370 IV COMPARISON:  Chest radiographs dated 01/28/2017 FINDINGS: Cardiovascular: Satisfactory opacification of the bilateral pulmonary arteries to the segmental level. Multiple lobar pulmonary emboli in the distal right main pulmonary artery (series 5/ image 87), extending into the upper and lower lobes, as well as the bifurcation of the left upper and lower pulmonary arteries (series 5/ image 45), and extending into the bilateral lower lobes (series 5/ images 59-60). Overall clot burden is moderate to large. RV to LV ratio 0.97.  No evidence of right heart strain. The heart is normal in size.  No pericardial effusion. No evidence of thoracic aortic aneurysm or dissection. Mediastinum/Nodes: No suspicious mediastinal lymphadenopathy. Lungs/Pleura: Multifocal subpleural patchy opacities in the posterior right middle  lobe (series 7/image 67, lingula (series 7/image 32), and left lower lobe (series 7/image 36). In this clinical setting, this appearance is worrisome for early/ developing pulmonary infarcts. Additional linear scarring/ atelectasis in the left upper lobe. Mild patchy left lower lobe opacity, likely atelectasis. Trace left pleural effusion. No pneumothorax. Upper Abdomen: Visualized upper abdomen is notable for cholecystectomy clips. Musculoskeletal: Degenerative changes of the visualized thoracolumbar spine. Review of the MIP images confirms the above findings. IMPRESSION: Multifocal lobar pulmonary emboli in the bilateral upper and lower lobes, as described above. Overall clot burden is moderate to large. No evidence of right heart strain. Multifocal patchy subpleural opacities in the right middle lobe, lingula and bilateral lower lobes, worrisome for early developing pulmonary infarcts. Trace left pleural effusion. Critical Value/emergent results were called by telephone at the time of interpretation on 01/28/2017 at 5:56 pm to  Dr. Virgina Norfolk, who verbally acknowledged these results. Electronically Signed   By: Charline Bills M.D.   On: 01/28/2017 17:58    EKG: Independently reviewed. Sinus rhythm, wandering baseline.  Assessment/Plan  1. Acute pulmonary embolism - Pt presents with 3 days of chest pain and SOB  - CTA chest reveals acute PE in bilateral upper and lower lobes, mod-large overall clot burden, but no CT-evidence for heart-strain  - Initial troponin is 0.01; she has remained hemodynamically stable; no hypoxia  - She was started on heparin infusion in ED  - She has hx of UE clot precipitated by IV; denies FHx of VTE, non-smoker, not on OCP or hormone therapy; no recent immobilization  - Plan to monitor on telemetry, continue heparin infusion, obtain serial troponin measurements, check echocardiogram, and venous US bilateral LE's    2. Hypertension  - BP mildly elevated in ED; pain likely contributing  - Managed at home with Toprol; will hold for now given acute PE and manage with prn hydralazine IVP's    3. Hypothyroidism  - Appears stable, continue Synthroid    4. Leukocytosis - WBC 13,700 on admission - No fever or apparent focus of infection  - Likely reactive to the acute PE  - Culture if febrile    DVT prophylaxis: heparin infusion Code Status: Full  Family Communication: Discussed with patient Disposition Plan: Admit to SDU Consults called: None Admission status: Inpatient    Briscoe Deutscher, MD [facility] Pager [phone]  If 7PM-7AM, please contact night-coverage [email or web address] Password [facility]  01/28/2017, 8:36 PM

## 2017-01-28 NOTE — ED Notes (Signed)
Patient taken to XRAY

## 2017-01-28 NOTE — ED Notes (Signed)
All heparin administration verified with Vergia Alconasey H. RN at bedside

## 2017-01-28 NOTE — ED Notes (Signed)
Patient taken to CT.

## 2017-01-28 NOTE — ED Notes (Signed)
Patient here for uncontrollable left sided chest and back pain.  Hurts more with palpation.  Has family hx of cardiac disease.  Troponin was negative.  Is being admitted for acute pulmonary embolism.  CT showed acute bilateral multifocal PE.  Patient has heparin running and is on 2L nasal cannula with O2 saturation at 98%.  Patient is A&Ox4 at this time and has pain with any movement.

## 2017-01-28 NOTE — ED Notes (Signed)
Report attempted x 1

## 2017-01-28 NOTE — ED Provider Notes (Signed)
[facility]-EMERGENCY DEPT Provider Note   CSN: [id number] Arrival date & time: 01/28/17  1515     History   Chief Complaint Chief Complaint  Patient presents with  . Chest Pain    HPI Felicia King is a 46 y.o. female.  The history is provided by the patient.  Chest Pain   This is a new problem. The current episode started 2 days ago. The problem occurs daily. The problem has been gradually worsening. The pain is associated with rest. The pain is present in the substernal region. The pain is at a severity of 9/10. The pain is severe. The quality of the pain is described as heavy, sharp and stabbing. The pain radiates to the upper back and left jaw. Associated symptoms include lower extremity edema, nausea and shortness of breath. Pertinent negatives include no abdominal pain, no back pain, no claudication, no cough, no diaphoresis, no dizziness, no exertional chest pressure, no fever, no headaches, no hemoptysis, no irregular heartbeat, no leg pain, no malaise/fatigue, no near-syncope, no numbness, no orthopnea, no palpitations, no syncope, no vomiting and no weakness. She has tried nothing for the symptoms. The treatment provided no relief. Risk factors include hormone replacement therapy and obesity.  Her past medical history is significant for hyperlipidemia and hypertension.  Pertinent negatives for past medical history include no seizures.  Her family medical history is significant for CAD, heart disease, hyperlipidemia and hypertension.    Past Medical History:  Diagnosis Date  . Headache    migraine  . Hypercholesteremia   . Hypertension   . Intervertebral disk disease    lumbar    Patient Active Problem List   Diagnosis Date Noted  . Essential hypertension 01/28/2017  . Hypothyroidism 01/28/2017  . Acute pulmonary embolism (HCC) 01/28/2017    Past Surgical History:  Procedure Laterality Date  . BACK SURGERY  1995   L5, S1, L4- disk herniation  . BACK SURGERY  2002   L4, L5  . CHOLECYSTECTOMY  2005  . TOTAL ABDOMINAL HYSTERECTOMY  2002    OB History    No data available       Home Medications    Prior to Admission medications   Medication Sig Start Date End Date Taking? Authorizing Provider  aspirin EC 81 MG tablet Take 81-162 mg by mouth See admin instructions. Take 1 tablet (81 mg) by mouth every morning, may take 2 tablets (162) if in pain when waking up   Yes [provider]  atorvastatin (LIPITOR) 40 MG tablet Take 40 mg by mouth at bedtime. For high cholesterol 11/15/15  Yes [provider]  baclofen (LIORESAL) 20 MG tablet Take 20 mg by mouth 3 (three) times daily as needed for muscle spasms.   Yes [provider]  busPIRone (BUSPAR) 5 MG tablet Take 5 mg by mouth 2 (two) times daily. For anxiety 10/17/16  Yes [provider]  cholecalciferol (VITAMIN D) 1000 units tablet Take 1,000 Units by mouth daily. 06/16/15  Yes [provider]  Diclofenac Sodium 1.5 % SOLN Place 10 drops onto the skin 2 (two) times daily as needed (neck, shoulders and chest pain).   Yes [provider]  dicyclomine (BENTYL) 10 MG capsule Take 10 mg by mouth 3 (three) times daily before meals.   Yes [provider]  DULoxetine (CYMBALTA) 60 MG capsule Take 60 mg by mouth at bedtime. For depression 07/09/16  Yes [provider]  fluticasone (FLONASE) 50 MCG/ACT nasal spray Place 1  spray into both nostrils at bedtime as needed for allergies or rhinitis.  08/23/16  Yes [provider]  gabapentin (NEURONTIN) 300 MG capsule Take 300 mg by mouth 3 (three) times daily as needed (nerve pain).  09/07/16 09/07/17 Yes [provider]  levothyroxine (SYNTHROID, LEVOTHROID) 200 MCG tablet Take 200 mcg by mouth daily. 09/21/16 09/21/17 Yes [provider]  metoprolol succinate (TOPROL-XL) 50 MG 24 hr tablet Take 50 mg by mouth daily. For high blood pressure 09/21/16  Yes [provider]    mirabegron ER (MYRBETRIQ) 50 MG TB24 tablet Take 50 mg by mouth at bedtime. For bladder incontinence   Yes [provider]  Multiple Vitamin (MULTIVITAMIN WITH MINERALS) TABS tablet Take 1 tablet by mouth daily. Daily Women's Energy Vitamin   Yes [provider]  omeprazole (PRILOSEC) 20 MG capsule Take 20 mg by mouth at bedtime. For stomach acid 11/15/15  Yes [provider]  pramipexole (MIRAPEX) 1.5 MG tablet Take 0.75 mg by mouth See admin instructions. Take 1/2 tablet (0.75 mg) by mouth every evening (4p-5p) and at bedtime - for restless legs 03/26/16  Yes [provider]  SUMAtriptan (IMITREX) 50 MG tablet Take 50 mg by mouth every 2 (two) hours as needed for migraine. May repeat in 2 hours if headache persists or recurs - max 3 tablets per week   Yes [provider]  vitamin C (ASCORBIC ACID) 500 MG tablet Take 500 mg by mouth daily.   Yes [provider]    Family History History reviewed. No pertinent family history.  Social History Social History  Substance Use Topics  . Smoking status: Never Smoker  . Smokeless tobacco: Never Used  . Alcohol use Yes     Comment: "sometimes"     Allergies   Morphine and related and Sulfa antibiotics   Review of Systems Review of Systems  Constitutional: Negative for chills, diaphoresis, fever and malaise/fatigue.  HENT: Negative for ear pain and sore throat.   Eyes: Negative for pain and visual disturbance.  Respiratory: Positive for shortness of breath. Negative for cough and hemoptysis.   Cardiovascular: Positive for chest pain. Negative for palpitations, orthopnea, claudication, syncope and near-syncope.  Gastrointestinal: Positive for nausea. Negative for abdominal pain and vomiting.  Genitourinary: Negative for dysuria and hematuria.  Musculoskeletal: Negative for arthralgias and back pain.  Skin: Negative for color change and rash.  Neurological: Negative for dizziness, seizures,  syncope, weakness, numbness and headaches.  All other systems reviewed and are negative.    Physical Exam Updated Vital Signs  ED Triage Vitals  Enc Vitals Group     BP 01/28/17 1526 (!) 146/89     Pulse Rate 01/28/17 1526 87     Resp 01/28/17 1526 (!) 26     Temp 01/28/17 1526 99.3 F (37.4 C)     Temp Source 01/28/17 1526 Oral     SpO2 01/28/17 1526 96 %     Weight --      Height --      Head Circumference --      Peak Flow --      Pain Score 01/28/17 1525 10     Pain Loc --      Pain Edu? --      Excl. in GC? --     Physical Exam  Constitutional: She is oriented to person, place, and time. She appears well-developed. She appears distressed.  HENT:  Head: Normocephalic and atraumatic.  Eyes: Conjunctivae and EOM  are normal. Pupils are equal, round, and reactive to light.  Neck: Normal range of motion. Neck supple.  Cardiovascular: Normal rate, regular rhythm, normal heart sounds and intact distal pulses.   No murmur heard. Pulmonary/Chest: Effort normal and breath sounds normal. No respiratory distress. She has no wheezes. She has no rales.  Abdominal: Soft. Bowel sounds are normal. She exhibits distension. There is no tenderness.  Musculoskeletal: Normal range of motion. She exhibits edema (b/l LE).  Neurological: She is alert and oriented to person, place, and time.  Skin: Skin is warm and dry. Capillary refill takes less than 2 seconds.  Psychiatric: She has a normal mood and affect.  Nursing note and vitals reviewed.    ED Treatments / Results  Labs (all labs ordered are listed, but only abnormal results are displayed) Labs Reviewed  BASIC METABOLIC PANEL - Abnormal; Notable for the following:       Result Value   Glucose, Bld 105 (*)    All other components within normal limits  CBC - Abnormal; Notable for the following:    WBC 13.7 (*)    RDW 16.6 (*)    All other components within normal limits  MRSA PCR SCREENING  BRAIN NATRIURETIC PEPTIDE  HEPARIN  LEVEL (UNFRACTIONATED)  HEPARIN LEVEL (UNFRACTIONATED)  CBC  HIV ANTIBODY (ROUTINE TESTING)  BASIC METABOLIC PANEL  TROPONIN I  TROPONIN I  TROPONIN I  I-STAT TROPOININ, ED    EKG  EKG Interpretation  Date/Time:  Monday Jan 28 2017 15:25:41 EDT Ventricular Rate:  86 PR Interval:    QRS Duration: 90 QT Interval:  371 QTC Calculation: 444 R Axis:   25 Text Interpretation:  Sinus rhythm Baseline wander in lead(s) I II III aVR aVL aVF V2 V3 V4 V5 V6  Q wave in lead 3.  No prior ECG for comparison.  No STEMI Confirmed by Theda Belfast ([id number]) on 01/28/2017 3:37:08 PM       Radiology Dg Chest 2 View  Result Date: 01/28/2017 CLINICAL DATA:  Nausea, vomiting, and diarrhea for 1 week, LEFT side chest pain for 2 days, LEFT neck pain and head pain, worsening chest pain worsened when lying flat, history hypertension EXAM: CHEST  2 VIEW COMPARISON:  04/06/2006 FINDINGS: Borderline enlargement of cardiac silhouette. Mediastinal contours and pulmonary vascularity normal. Decreased lung volumes with bibasilar atelectasis. Lungs otherwise clear. No pleural effusion or pneumothorax. Scattered degenerative disc disease changes of the thoracic spine. IMPRESSION: Low lung volumes with bibasilar atelectasis. Electronically Signed   By: Ulyses Southward M.D.   On: 01/28/2017 16:32   Ct Angio Chest Pe W Or Wo Contrast  Result Date: 01/28/2017 CLINICAL DATA:  Chest pain, shortness of breath x2 days EXAM: CT ANGIOGRAPHY CHEST WITH CONTRAST TECHNIQUE: Multidetector CT imaging of the chest was performed using the standard protocol during bolus administration of intravenous contrast. Multiplanar CT image reconstructions and MIPs were obtained to evaluate the vascular anatomy. CONTRAST:  100 mL Isovue 370 IV COMPARISON:  Chest radiographs dated 01/28/2017 FINDINGS: Cardiovascular: Satisfactory opacification of the bilateral pulmonary arteries to the segmental level. Multiple lobar pulmonary emboli in the distal right  main pulmonary artery (series 5/ image 87), extending into the upper and lower lobes, as well as the bifurcation of the left upper and lower pulmonary arteries (series 5/ image 45), and extending into the bilateral lower lobes (series 5/ images 59-60). Overall clot burden is moderate to large. RV to LV ratio 0.97.  No evidence of right heart strain. The heart  is normal in size.  No pericardial effusion. No evidence of thoracic aortic aneurysm or dissection. Mediastinum/Nodes: No suspicious mediastinal lymphadenopathy. Lungs/Pleura: Multifocal subpleural patchy opacities in the posterior right middle lobe (series 7/image 67, lingula (series 7/image 32), and left lower lobe (series 7/image 36). In this clinical setting, this appearance is worrisome for early/ developing pulmonary infarcts. Additional linear scarring/ atelectasis in the left upper lobe. Mild patchy left lower lobe opacity, likely atelectasis. Trace left pleural effusion. No pneumothorax. Upper Abdomen: Visualized upper abdomen is notable for cholecystectomy clips. Musculoskeletal: Degenerative changes of the visualized thoracolumbar spine. Review of the MIP images confirms the above findings. IMPRESSION: Multifocal lobar pulmonary emboli in the bilateral upper and lower lobes, as described above. Overall clot burden is moderate to large. No evidence of right heart strain. Multifocal patchy subpleural opacities in the right middle lobe, lingula and bilateral lower lobes, worrisome for early developing pulmonary infarcts. Trace left pleural effusion. Critical Value/emergent results were called by telephone at the time of interpretation on 01/28/2017 at 5:56 pm to Dr. Virgina Norfolk, who verbally acknowledged these results. Electronically Signed   By: Charline Bills M.D.   On: 01/28/2017 17:58    Procedures Procedures (including critical care time)  Medications Ordered in ED Medications  heparin ADULT infusion 100 units/mL (25000 units/215mL  sodium chloride 0.45%) (1,500 Units/hr Intravenous New Bag/Given 01/28/17 1837)  baclofen (LIORESAL) tablet 20 mg (not administered)  dicyclomine (BENTYL) capsule 10 mg (not administered)  mirabegron ER (MYRBETRIQ) tablet 50 mg (50 mg Oral Given 01/28/17 2251)  multivitamin with minerals tablet 1 tablet (not administered)  busPIRone (BUSPAR) tablet 5 mg (5 mg Oral Given 01/28/17 2252)  atorvastatin (LIPITOR) tablet 40 mg (40 mg Oral Given 01/28/17 2257)  aspirin EC tablet 81 mg (not administered)  cholecalciferol (VITAMIN D) tablet 1,000 Units (not administered)  DULoxetine (CYMBALTA) DR capsule 60 mg (60 mg Oral Given 01/28/17 2252)  gabapentin (NEURONTIN) capsule 300 mg (not administered)  levothyroxine (SYNTHROID, LEVOTHROID) tablet 200 mcg (not administered)  pantoprazole (PROTONIX) EC tablet 40 mg (40 mg Oral Given 01/28/17 2258)  vitamin C (ASCORBIC ACID) tablet 500 mg (not administered)  sodium chloride flush (NS) 0.9 % injection 3 mL (3 mLs Intravenous Not Given 01/28/17 2257)  0.9 %  sodium chloride infusion ( Intravenous New Bag/Given 01/28/17 2149)  acetaminophen (TYLENOL) tablet 650 mg (not administered)    Or  acetaminophen (TYLENOL) suppository 650 mg (not administered)  HYDROcodone-acetaminophen (NORCO/VICODIN) 5-325 MG per tablet 1-2 tablet (not administered)  ondansetron (ZOFRAN) tablet 4 mg (not administered)    Or  ondansetron (ZOFRAN) injection 4 mg (not administered)  polyethylene glycol (MIRALAX / GLYCOLAX) packet 17 g (not administered)  hydrALAZINE (APRESOLINE) injection 5-10 mg (not administered)  pramipexole (MIRAPEX) tablet 0.75 mg (0.75 mg Oral Given 01/28/17 2252)  fentaNYL (SUBLIMAZE) injection 25-50 mcg (25 mcg Intravenous Given 01/28/17 2158)  iopamidol (ISOVUE-370) 76 % injection (100 mLs  Contrast Given 01/28/17 1718)  fentaNYL (SUBLIMAZE) injection 50 mcg (50 mcg Intravenous Given 01/28/17 1837)  heparin bolus via infusion 5,800 Units (5,800 Units Intravenous Bolus  from Bag 01/28/17 1840)     Initial Impression / Assessment and Plan / ED Course  I have reviewed the triage vital signs and the nursing notes.  Pertinent labs & imaging results that were available during my care of the patient were reviewed by me and considered in my medical decision making (see chart for details).     AAYRA HORNBAKER is a 46 year old female with history  of high cholesterol, hypertension, migraines who presents to the ED with chest pain, shortness of breath. Patient's vitals at time of arrival to the ED are significant for hypertension but otherwise are unremarkable. Patient does have some tachypnea on examination. Patient states that she has had 2 days of intermittent chest pain worse with exertion specifically worse over the last several hours and is constant and radiates into her back, left jaw. Describes pain is tearing. Patient is in clear distress. EMS gave patient 324 of aspirin but no nitroglycerin. Patient states that she has never had a clot before but is on estrogen. Patient otherwise with no DVT or PE risk factors. Patient with no abdominal pain but states that she has had some leg swelling the last several months as well. Patient states that she has a significant family history of cardiac disease with both her mother and her father with significant coronary artery disease in their 5340s. Concern for PE versus dissection versus ACS. Patient does have good pulses throughout on examination but is in a lot of distress. Otherwise exam is unremarkable. EKG shows normal sinus rhythm with T-wave inversions in V1, V2 as well as Q wave in lead 3. No prior EKG to compare to. Patient denies infectious symptoms. Will get CBC, BMP, troponin, BNP, chest x-ray. Contacted radiology as concerned for both dissection and PE and they recommend that CT PE study will allow for best evaluation for both. Patient given nitroglycerin.  Patient with no relief of pain from nitroglycerin. Patient with  unremarkable CBC and BMP with no significant anemia or electrolyte abnormalities. Patient with troponin and BNP within normal limits. Chest x-ray with no signs of pneumonia, pneumothorax, pleural effusion. Patient with CT PE study which shows moderate to large clot burden with no evidence of saddle pulmonary embolus. Radiology called to discuss these findings. Patient was started on heparin bolus and drip and admitted to medicine service as patient remained hemodynamically stable throughout ED stay.  Final Clinical Impressions(s) / ED Diagnoses   Final diagnoses:  Other acute pulmonary embolism without acute cor pulmonale [facility](HCC)    New Prescriptions Current Discharge Medication List       Virgina NorfolkCuratolo, Felicia Blecha, DO 01/28/17 2339    Tegeler, Canary Brimhristopher J, MD 02/05/17 1459

## 2017-01-28 NOTE — ED Notes (Signed)
Patient is stable and ready to be transport to the floor at this time.  Report was called to 4N RN.  Belongings taken with the patient to the floor.   

## 2017-01-28 NOTE — ED Notes (Signed)
Admitting MD at bedside.

## 2017-01-29 ENCOUNTER — Other Ambulatory Visit (HOSPITAL_COMMUNITY): Payer: Self-pay

## 2017-01-29 ENCOUNTER — Inpatient Hospital Stay (HOSPITAL_COMMUNITY): Payer: Medicare HMO

## 2017-01-29 DIAGNOSIS — I2699 Other pulmonary embolism without acute cor pulmonale: Secondary | ICD-10-CM

## 2017-01-29 LAB — CBC
HEMATOCRIT: 37.4 % (ref 36.0–46.0)
Hemoglobin: 11.3 g/dL — ABNORMAL LOW (ref 12.0–15.0)
MCH: 25.7 pg — AB (ref 26.0–34.0)
MCHC: 30.2 g/dL (ref 30.0–36.0)
MCV: 85.2 fL (ref 78.0–100.0)
PLATELETS: 268 10*3/uL (ref 150–400)
RBC: 4.39 MIL/uL (ref 3.87–5.11)
RDW: 17 % — ABNORMAL HIGH (ref 11.5–15.5)
WBC: 13 10*3/uL — AB (ref 4.0–10.5)

## 2017-01-29 LAB — BASIC METABOLIC PANEL
Anion gap: 9 (ref 5–15)
BUN: 11 mg/dL (ref 6–20)
CO2: 26 mmol/L (ref 22–32)
Calcium: 8.8 mg/dL — ABNORMAL LOW (ref 8.9–10.3)
Chloride: 105 mmol/L (ref 101–111)
Creatinine, Ser: 0.83 mg/dL (ref 0.44–1.00)
GLUCOSE: 116 mg/dL — AB (ref 65–99)
Potassium: 3.5 mmol/L (ref 3.5–5.1)
SODIUM: 140 mmol/L (ref 135–145)

## 2017-01-29 LAB — TROPONIN I: Troponin I: <0.03 ng/mL (ref <0.03)

## 2017-01-29 LAB — HIV ANTIBODY (ROUTINE TESTING W REFLEX): HIV Screen 4th Generation wRfx: NONREACTIVE

## 2017-01-29 LAB — HEPARIN LEVEL (UNFRACTIONATED)
Heparin Unfractionated: 0.21 IU/mL — ABNORMAL LOW (ref 0.30–0.70)
Heparin Unfractionated: 0.31 IU/mL (ref 0.30–0.70)

## 2017-01-29 MED ORDER — WHITE PETROLATUM GEL
Status: AC
  Administered 2017-01-29: 12:00:00
  Filled 2017-01-29: qty 1

## 2017-01-29 MED ORDER — HEPARIN BOLUS VIA INFUSION
1500.0000 [IU] | Freq: Once | INTRAVENOUS | Status: AC
  Administered 2017-01-29: 1500 [IU] via INTRAVENOUS
  Filled 2017-01-29: qty 1500

## 2017-01-29 MED ORDER — OXYCODONE HCL 5 MG PO TABS
5.0000 mg | ORAL_TABLET | Freq: Once | ORAL | Status: AC | PRN
  Administered 2017-01-29: 5 mg via ORAL
  Filled 2017-01-29: qty 1

## 2017-01-29 NOTE — Progress Notes (Signed)
ANTICOAGULATION CONSULT NOTE - Follow-up Consult  Pharmacy Consult for heparin Indication: pulmonary embolus  Allergies  Allergen Reactions  . Morphine And Related Itching  . Sulfa Antibiotics Anaphylaxis    Patient Measurements: Height: 5\' 2"  (157.5 cm) Weight: 295 lb (133.8 kg) IBW/kg (Calculated) : 50.1 Heparin Dosing Weight: 84 kg  Vital Signs: Temp: 98.5 F (36.9 C) (05/21 2308) Temp Source: Oral (05/21 2308) BP: 125/81 (05/21 2308) Pulse Rate: 87 (05/21 2308)  Labs:  Recent Labs  01/28/17 1702 01/29/17 0012  HGB 12.4  --   HCT 39.5  --   PLT 289  --   HEPARINUNFRC  --  0.21*  CREATININE 0.95  --     Estimated Creatinine Clearance: 98.7 mL/min (by C-G formula based on SCr of 0.95 mg/dL).  Assessment: 46yo F on heparin for PE. Heparin level subtherapeutic (0.21) on gtt at 1500 units/hr. No issues with line or bleeding reported per RN.  Goal of Therapy:  Heparin level 0.3-0.7 units/ml Monitor platelets by anticoagulation protocol: Yes   Plan:  Rebolus heparin 1500 units Increase gtt to 1800 units/hr Will f/u heparin level in 6 hours  Christoper Fabianaron Melysa Schroyer, PharmD, BCPS Clinical pharmacist, pager [phone] 01/29/2017 12:40 AM

## 2017-01-29 NOTE — Progress Notes (Signed)
ANTICOAGULATION CONSULT NOTE - Follow Up Consult  Pharmacy Consult for heparin Indication: pulmonary embolus  Allergies  Allergen Reactions  . Morphine And Related Itching  . Sulfa Antibiotics Anaphylaxis    Patient Measurements: Height: 5\' 2"  (157.5 cm) Weight: 295 lb (133.8 kg) IBW/kg (Calculated) : 50.1 Heparin Dosing Weight: 84 kg  Vital Signs: Temp: 98.5 F (36.9 C) (05/22 1636) Temp Source: Oral (05/22 1636) BP: 130/69 (05/22 1636) Pulse Rate: 90 (05/22 1636)  Labs:  Recent Labs  01/28/17 1702 01/29/17 0012 01/29/17 0410 01/29/17 1950  HGB 12.4  --  11.3*  --   HCT 39.5  --  37.4  --   PLT 289  --  268  --   HEPARINUNFRC  --  0.21*  --  0.31  CREATININE 0.95  --  0.83  --   TROPONINI  --  <0.03 <0.03  --     Estimated Creatinine Clearance: 113 mL/min (by C-G formula based on SCr of 0.83 mg/dL).   Medications:  Scheduled:  . aspirin EC  81 mg Oral Daily  . atorvastatin  40 mg Oral q1800  . busPIRone  5 mg Oral BID  . cholecalciferol  1,000 Units Oral Daily  . dicyclomine  10 mg Oral TID AC  . DULoxetine  60 mg Oral QHS  . levothyroxine  200 mcg Oral QAC breakfast  . mirabegron ER  50 mg Oral QHS  . multivitamin with minerals  1 tablet Oral Daily  . pantoprazole  40 mg Oral QPM  . pramipexole  0.75 mg Oral 2 times per day  . sodium chloride flush  3 mL Intravenous Q12H  . vitamin C  500 mg Oral Daily   Infusions:  . heparin 1,800 Units/hr (01/29/17 0617)    Assessment: 46 yo female with PE is currently on therapeutic heparin but heparin level is on lower side.  Level is 0.31.    Goal of Therapy:  Heparin level 0.3-0.7 units/ml Monitor platelets by anticoagulation protocol: Yes   Plan:  - increase heparin to 1900 units/hr - 6 hr heparin level   Chrystal Zeimet, Tsz-Yin 01/29/2017,8:32 PM

## 2017-01-29 NOTE — Progress Notes (Signed)
PROGRESS NOTE    Felicia LONGAN  King:[id number] DOB: [birth year] DOA: 01/28/2017 PCP: Dan Maker, MD   Brief Narrative:   Felicia King is a 46 y.o. female with medical history significant for hypertension, chronic back pain, and hypothyroidism who presents to the emergency department with severe chest pain for the past 2-3 days. Patient reports that she had been in her usual state of health until she noted the insidious development of chest pain approximately 3 days ago. CTA study was performed and notable for multifocal lobar PE in bilateral upper and lower lobes with overall clot burden moderate to large, but no evidence for heart strain.   Assessment & Plan:   Principal Problem:   Acute pulmonary embolism (HCC) Active Problems:   Essential hypertension   Hypothyroidism   Chest pain  Acute Multifocal lobar PE - Mod-Large Clot burden  -likely secondary to sedentary life style.  -cont heparin drip at this time. Spoke with the patient about NOAC vs Coumadin, she prefers NOAC. Will consult CM to help with cost. Hx of UE clot from previous IV several years ago which was tx with coumadin therefore she is quit aware of dietary restriction and frequent INR checks.  -Echo and LE dopplers pending at this time.  -pain control   HTN -home meds on hold, cont hydralazine IV prn   Hypothyroidism  -cont synthroid   Leukocytosis -no signs of infection, likely from PE -monitor for now   Anemia? -unknown etiology  -check iron studies, ferritin  DVT prophylaxis: Hep drip  Code Status: Full  Family Communication:  None at bedside  Disposition Plan: SDU  Consultants:   None  Procedures:   None  Antimicrobials:   None   Subjective: States she still has little of chest pain with deep breaths and b/l LE discomfort.  She tells me she had been having b/l LE discomfort of the past 5 months and has worsened since then. She take computer/online classes and sits on the desk for  several hours a day.  Family hx of blood clot in mother but it was secondary to [facility] procedure per patietn.   Objective: Vitals:   01/29/17 0000 01/29/17 0359 01/29/17 0845 01/29/17 0900  BP: (!) 141/71 118/76 115/75 129/74  Pulse: 86 72 81 75  Resp: (!) 22 16 (!) 21 18  Temp:  98.6 F (37 C) 98.4 F (36.9 C)   TempSrc:  Oral Oral   SpO2: 96% 98% 96% 96%  Weight:      Height:        Intake/Output Summary (Last 24 hours) at 01/29/17 1139 Last data filed at 01/29/17 1100  Gross per 24 hour  Intake            459.6 ml  Output              150 ml  Net            309.6 ml   Filed Weights   01/28/17 1800  Weight: 133.8 kg (295 lb)    Examination:  General exam: Appears calm and comfortable  Respiratory system: Clear to auscultation. Respiratory effort normal. Cardiovascular system: S1 & S2 heard, RRR. No JVD, murmurs, rubs, gallops or clicks. No pedal edema. Gastrointestinal system: Abdomen is nondistended, soft and nontender. No organomegaly or masses felt. Normal bowel sounds heard. Central nervous system: Alert and oriented. No focal neurological deficits. Extremities: Symmetric 5 x 5 power. Skin: No rashes, lesions or ulcers Psychiatry: Judgement and  insight appear normal. Mood & affect appropriate.     Data Reviewed:   CBC:  Recent Labs Lab 01/28/17 1702 01/29/17 0410  WBC 13.7* 13.0*  HGB 12.4 11.3*  HCT 39.5 37.4  MCV 85.1 85.2  PLT 289 268   Basic Metabolic Panel:  Recent Labs Lab 01/28/17 1702 01/29/17 0410  NA 138 140  K 3.8 3.5  CL 104 105  CO2 24 26  GLUCOSE 105* 116*  BUN 12 11  CREATININE 0.95 0.83  CALCIUM 9.2 8.8*   GFR: Estimated Creatinine Clearance: 113 mL/min (by C-G formula based on SCr of 0.83 mg/dL). Liver Function Tests: No results for input(s): AST, ALT, ALKPHOS, BILITOT, PROT, ALBUMIN in the last 168 hours. No results for input(s): LIPASE, AMYLASE in the last 168 hours. No results for input(s): AMMONIA in the last 168  hours. Coagulation Profile: No results for input(s): INR, PROTIME in the last 168 hours. Cardiac Enzymes:  Recent Labs Lab 01/29/17 0012 01/29/17 0410  TROPONINI <0.03 <0.03   BNP (last 3 results) No results for input(s): PROBNP in the last 8760 hours. HbA1C: No results for input(s): HGBA1C in the last 72 hours. CBG: No results for input(s): GLUCAP in the last 168 hours. Lipid Profile: No results for input(s): CHOL, HDL, LDLCALC, TRIG, CHOLHDL, LDLDIRECT in the last 72 hours. Thyroid Function Tests: No results for input(s): TSH, T4TOTAL, FREET4, T3FREE, THYROIDAB in the last 72 hours. Anemia Panel: No results for input(s): VITAMINB12, FOLATE, FERRITIN, TIBC, IRON, RETICCTPCT in the last 72 hours. Sepsis Labs: No results for input(s): PROCALCITON, LATICACIDVEN in the last 168 hours.  Recent Results (from the past 240 hour(s))  MRSA PCR Screening     Status: None   Collection Time: 01/28/17  8:09 PM  Result Value Ref Range Status   MRSA by PCR NEGATIVE NEGATIVE Final    Comment:        The GeneXpert MRSA Assay (FDA approved for NASAL specimens only), is one component of a comprehensive MRSA colonization surveillance program. It is not intended to diagnose MRSA infection nor to guide or monitor treatment for MRSA infections.          Radiology Studies: Dg Chest 2 View  Result Date: 01/28/2017 CLINICAL DATA:  Nausea, vomiting, and diarrhea for 1 week, LEFT side chest pain for 2 days, LEFT neck pain and head pain, worsening chest pain worsened when lying flat, history hypertension EXAM: CHEST  2 VIEW COMPARISON:  04/06/2006 FINDINGS: Borderline enlargement of cardiac silhouette. Mediastinal contours and pulmonary vascularity normal. Decreased lung volumes with bibasilar atelectasis. Lungs otherwise clear. No pleural effusion or pneumothorax. Scattered degenerative disc disease changes of the thoracic spine. IMPRESSION: Low lung volumes with bibasilar atelectasis.  Electronically Signed   By: Ulyses Southward M.D.   On: 01/28/2017 16:32   Ct Angio Chest Pe W Or Wo Contrast  Result Date: 01/28/2017 CLINICAL DATA:  Chest pain, shortness of breath x2 days EXAM: CT ANGIOGRAPHY CHEST WITH CONTRAST TECHNIQUE: Multidetector CT imaging of the chest was performed using the standard protocol during bolus administration of intravenous contrast. Multiplanar CT image reconstructions and MIPs were obtained to evaluate the vascular anatomy. CONTRAST:  100 mL Isovue 370 IV COMPARISON:  Chest radiographs dated 01/28/2017 FINDINGS: Cardiovascular: Satisfactory opacification of the bilateral pulmonary arteries to the segmental level. Multiple lobar pulmonary emboli in the distal right main pulmonary artery (series 5/ image 87), extending into the upper and lower lobes, as well as the bifurcation of the left upper and lower  pulmonary arteries (series 5/ image 45), and extending into the bilateral lower lobes (series 5/ images 59-60). Overall clot burden is moderate to large. RV to LV ratio 0.97.  No evidence of right heart strain. The heart is normal in size.  No pericardial effusion. No evidence of thoracic aortic aneurysm or dissection. Mediastinum/Nodes: No suspicious mediastinal lymphadenopathy. Lungs/Pleura: Multifocal subpleural patchy opacities in the posterior right middle lobe (series 7/image 67, lingula (series 7/image 32), and left lower lobe (series 7/image 36). In this clinical setting, this appearance is worrisome for early/ developing pulmonary infarcts. Additional linear scarring/ atelectasis in the left upper lobe. Mild patchy left lower lobe opacity, likely atelectasis. Trace left pleural effusion. No pneumothorax. Upper Abdomen: Visualized upper abdomen is notable for cholecystectomy clips. Musculoskeletal: Degenerative changes of the visualized thoracolumbar spine. Review of the MIP images confirms the above findings. IMPRESSION: Multifocal lobar pulmonary emboli in the  bilateral upper and lower lobes, as described above. Overall clot burden is moderate to large. No evidence of right heart strain. Multifocal patchy subpleural opacities in the right middle lobe, lingula and bilateral lower lobes, worrisome for early developing pulmonary infarcts. Trace left pleural effusion. Critical Value/emergent results were called by telephone at the time of interpretation on 01/28/2017 at 5:56 pm to Dr. Virgina NorfolkADAM CURATOLO, who verbally acknowledged these results. Electronically Signed   By: Charline BillsSriyesh  Krishnan M.D.   On: 01/28/2017 17:58        Scheduled Meds: . aspirin EC  81 mg Oral Daily  . atorvastatin  40 mg Oral q1800  . busPIRone  5 mg Oral BID  . cholecalciferol  1,000 Units Oral Daily  . dicyclomine  10 mg Oral TID AC  . DULoxetine  60 mg Oral QHS  . levothyroxine  200 mcg Oral QAC breakfast  . mirabegron ER  50 mg Oral QHS  . multivitamin with minerals  1 tablet Oral Daily  . pantoprazole  40 mg Oral QPM  . pramipexole  0.75 mg Oral 2 times per day  . sodium chloride flush  3 mL Intravenous Q12H  . vitamin C  500 mg Oral Daily   Continuous Infusions: . heparin 1,800 Units/hr (01/29/17 0617)     LOS: 1 day    Time spent: 35 mins     Jovanka Westgate Joline Maxcyhirag Shayma Pfefferle, MD [facility] Pager [phone]   If 7PM-7AM, please contact night-coverage [email or web address] Password [email or web address] 01/29/2017, 11:39 AM

## 2017-01-29 NOTE — Progress Notes (Signed)
*  PRELIMINARY RESULTS* Vascular Ultrasound Lower extremity venous duplex has been completed.  Preliminary findings: Thrombus noted in an intramuscular vein of the left calf (soleal vein).  No DVT RLE.    Farrel DemarkJill Eunice, RDMS, RVT  01/29/2017, 12:51 PM

## 2017-01-30 ENCOUNTER — Other Ambulatory Visit: Payer: Self-pay

## 2017-01-30 ENCOUNTER — Inpatient Hospital Stay (HOSPITAL_COMMUNITY): Payer: Medicare HMO

## 2017-01-30 DIAGNOSIS — I2601 Septic pulmonary embolism with acute cor pulmonale: Secondary | ICD-10-CM

## 2017-01-30 DIAGNOSIS — E032 Hypothyroidism due to medicaments and other exogenous substances: Secondary | ICD-10-CM

## 2017-01-30 DIAGNOSIS — I10 Essential (primary) hypertension: Secondary | ICD-10-CM

## 2017-01-30 DIAGNOSIS — I2699 Other pulmonary embolism without acute cor pulmonale: Secondary | ICD-10-CM

## 2017-01-30 LAB — IRON AND TIBC
IRON: 22 ug/dL — AB (ref 28–170)
Saturation Ratios: 10 % — ABNORMAL LOW (ref 10.4–31.8)
TIBC: 224 ug/dL — ABNORMAL LOW (ref 250–450)
UIBC: 202 ug/dL

## 2017-01-30 LAB — CBC
HCT: 37.8 % (ref 36.0–46.0)
HEMOGLOBIN: 11.5 g/dL — AB (ref 12.0–15.0)
MCH: 26.3 pg (ref 26.0–34.0)
MCHC: 30.4 g/dL (ref 30.0–36.0)
MCV: 86.5 fL (ref 78.0–100.0)
Platelets: 262 10*3/uL (ref 150–400)
RBC: 4.37 MIL/uL (ref 3.87–5.11)
RDW: 17.1 % — ABNORMAL HIGH (ref 11.5–15.5)
WBC: 11.4 10*3/uL — ABNORMAL HIGH (ref 4.0–10.5)

## 2017-01-30 LAB — FERRITIN: FERRITIN: 141 ng/mL (ref 11–307)

## 2017-01-30 LAB — HEPARIN LEVEL (UNFRACTIONATED): Heparin Unfractionated: 0.51 IU/mL (ref 0.30–0.70)

## 2017-01-30 LAB — ECHOCARDIOGRAM COMPLETE
HEIGHTINCHES: 62 in
WEIGHTICAEL: 4480 [oz_av]

## 2017-01-30 LAB — TROPONIN I: Troponin I: 0.03 ng/mL (ref <0.03)

## 2017-01-30 LAB — TSH: TSH: 3.826 u[IU]/mL (ref 0.350–4.500)

## 2017-01-30 MED ORDER — OXYCODONE HCL 5 MG PO TABS
10.0000 mg | ORAL_TABLET | Freq: Once | ORAL | Status: AC
  Administered 2017-01-30: 10 mg via ORAL
  Filled 2017-01-30: qty 2

## 2017-01-30 MED ORDER — IPRATROPIUM BROMIDE 0.02 % IN SOLN
0.5000 mg | Freq: Once | RESPIRATORY_TRACT | Status: AC
  Administered 2017-01-30: 0.5 mg via RESPIRATORY_TRACT
  Filled 2017-01-30: qty 2.5

## 2017-01-30 MED ORDER — ALBUTEROL SULFATE (2.5 MG/3ML) 0.083% IN NEBU
5.0000 mg | INHALATION_SOLUTION | Freq: Once | RESPIRATORY_TRACT | Status: AC
  Administered 2017-01-30: 2.5 mg via RESPIRATORY_TRACT
  Filled 2017-01-30: qty 6

## 2017-01-30 MED ORDER — ALBUTEROL SULFATE (2.5 MG/3ML) 0.083% IN NEBU: 5.0000 mg | INHALATION_SOLUTION | RESPIRATORY_TRACT | Status: DC | PRN

## 2017-01-30 NOTE — Progress Notes (Signed)
Pt C/o sharp stabbing chest pain, and Right groin pain and difficulty breathing Pt states "it feels like someone is stabbing me with a knife" MD notified   BP (!) 117/58 (BP Location: Right Arm)   Pulse 79   Temp 98.5 F (36.9 C) (Oral)   Resp 15   Ht 5\' 2"  (1.575 m)   Wt 127 kg (280 lb)   SpO2 92%   BMI 51.21 kg/m

## 2017-01-30 NOTE — Progress Notes (Signed)
  Echocardiogram 2D Echocardiogram has been performed.  Pieter PartridgeBrooke S Georganna Maxson 01/30/2017, 2:40 PM

## 2017-01-30 NOTE — Progress Notes (Addendum)
Pt c/o of sharp/stabbing intermittent chest pain 7/10 that is radiating to lt arm. Pt states pain is different from previous pain and it woke her out of her sleep. EKG done. Schorr Np notified, awaiting response

## 2017-01-30 NOTE — Progress Notes (Signed)
ANTICOAGULATION CONSULT NOTE - Follow Up Consult  Pharmacy Consult for heparin Indication: pulmonary embolus  Allergies  Allergen Reactions  . Morphine And Related Itching  . Sulfa Antibiotics Anaphylaxis    Patient Measurements: Height: 5\' 2"  (157.5 cm) Weight: 295 lb (133.8 kg) IBW/kg (Calculated) : 50.1 Heparin Dosing Weight: 84 kg  Vital Signs: Temp: 98.5 F (36.9 C) (05/23 0400) Temp Source: Oral (05/23 0400) BP: 128/69 (05/23 0500) Pulse Rate: 79 (05/23 0500)  Labs:  Recent Labs  01/28/17 1702 01/29/17 0012 01/29/17 0410 01/29/17 1950 01/30/17 0339 01/30/17 0346  HGB 12.4  --  11.3*  --  11.5*  --   HCT 39.5  --  37.4  --  37.8  --   PLT 289  --  268  --  262  --   HEPARINUNFRC  --  0.21*  --  0.31 0.51  --   CREATININE 0.95  --  0.83  --   --   --   TROPONINI  --  <0.03 <0.03  --   --  0.03*    Estimated Creatinine Clearance: 113 mL/min (by C-G formula based on SCr of 0.83 mg/dL).   Medications:  Scheduled:  . aspirin EC  81 mg Oral Daily  . atorvastatin  40 mg Oral q1800  . busPIRone  5 mg Oral BID  . cholecalciferol  1,000 Units Oral Daily  . dicyclomine  10 mg Oral TID AC  . DULoxetine  60 mg Oral QHS  . levothyroxine  200 mcg Oral QAC breakfast  . mirabegron ER  50 mg Oral QHS  . multivitamin with minerals  1 tablet Oral Daily  . pantoprazole  40 mg Oral QPM  . pramipexole  0.75 mg Oral 2 times per day  . sodium chloride flush  3 mL Intravenous Q12H  . vitamin C  500 mg Oral Daily   Infusions:  . heparin 1,900 Units/hr (01/29/17 2051)    Assessment: 46 yo female with PE no RH strain is currently on therapeutic heparin drip rate 1900 uts/hr HL 0.51 CBC stable no bleeding noted.   Goal of Therapy:  Heparin level 0.3-0.7 units/ml Monitor platelets by anticoagulation protocol: Yes   Plan:  Continue heparin to 1900 units/hr Daily CBC, HLO  Leota SauersLisa Raiyan Dalesandro Pharm.D. CPP, BCPS Clinical Pharmacist [phone] 01/30/2017 8:03 AM

## 2017-01-30 NOTE — Progress Notes (Signed)
PROGRESS NOTE    Felicia King  ZOX:[id number] DOB: 12-17-1970 DOA: 01/28/2017 PCP: Dan Maker, MD   Brief Narrative:   Felicia King is a 46 y.o. female with medical history significant for hypertension, chronic back pain, and hypothyroidism who presents to the emergency department with severe chest pain for the past 2-3 days. Patient reports that she had been in her usual state of health until she noted the insidious development of chest pain approximately 3 days ago. CTA study was performed and notable for multifocal lobar PE in bilateral upper and lower lobes with overall clot burden moderate to large, but no evidence for heart strain.   Assessment & Plan:   Principal Problem:   Acute pulmonary embolism (HCC) Active Problems:   Essential hypertension   Hypothyroidism   Chest pain  Acute Multifocal lobar PE - Mod-Large Clot burden  -likely secondary to sedentary life style.  -cont heparin drip at this time. Spoke with the patient about NOAC vs Coumadin, she prefers NOAC. Transition to Xarelto when able . Hx of UE clot from previous IV several years ago which was tx with coumadin therefore she is quit aware of dietary restriction and frequent INR checks.  Lower extremity Doppler Thrombus noted in an intramuscular vein of the left calf (soleal vein)  2-D echo pending    HTN -home meds on hold, cont hydralazine IV prn   Hypothyroidism  -cont synthroid , check TSH  Leukocytosis -no signs of infection, likely from PE -monitor for now   Anemia? -unknown etiology  -check iron studies, ferritin  DVT prophylaxis: Hep drip  Code Status: Full  Family Communication:  None at bedside  Disposition Plan: Transfer to telemetry  Consultants:   None  Procedures:   None  Antimicrobials:   None   Subjective:  Pt c/o of sharp/stabbing intermittent chest pain 7/10 that is radiating to lt arm, minimal shortness of breath  Objective: Vitals:   01/30/17 0300  01/30/17 0400 01/30/17 0500 01/30/17 0800  BP: 111/70 (!) 130/91 128/69 112/74  Pulse: 87 84 79 72  Resp: (!) 21 (!) 23 14 12   Temp:  98.5 F (36.9 C)  97.8 F (36.6 C)  TempSrc:  Oral  Oral  SpO2: 95% 96% 94% 97%  Weight:      Height:        Intake/Output Summary (Last 24 hours) at 01/30/17 0902 Last data filed at 01/30/17 0830  Gross per 24 hour  Intake           770.15 ml  Output              100 ml  Net           670.15 ml   Filed Weights   01/28/17 1800  Weight: 133.8 kg (295 lb)    Examination:  General exam: Appears calm and comfortable  Respiratory system: Clear to auscultation. Respiratory effort normal. Cardiovascular system: S1 & S2 heard, RRR. No JVD, murmurs, rubs, gallops or clicks. No pedal edema. Gastrointestinal system: Abdomen is nondistended, soft and nontender. No organomegaly or masses felt. Normal bowel sounds heard. Central nervous system: Alert and oriented. No focal neurological deficits. Extremities: Symmetric 5 x 5 power. Skin: No rashes, lesions or ulcers Psychiatry: Judgement and insight appear normal. Mood & affect appropriate.     Data Reviewed:   CBC:  Recent Labs Lab 01/28/17 1702 01/29/17 0410 01/30/17 0339  WBC 13.7* 13.0* 11.4*  HGB 12.4 11.3* 11.5*  HCT 39.5  37.4 37.8  MCV 85.1 85.2 86.5  PLT 289 268 262   Basic Metabolic Panel:  Recent Labs Lab 01/28/17 1702 01/29/17 0410  NA 138 140  K 3.8 3.5  CL 104 105  CO2 24 26  GLUCOSE 105* 116*  BUN 12 11  CREATININE 0.95 0.83  CALCIUM 9.2 8.8*   GFR: Estimated Creatinine Clearance: 113 mL/min (by C-G formula based on SCr of 0.83 mg/dL). Liver Function Tests: No results for input(s): AST, ALT, ALKPHOS, BILITOT, PROT, ALBUMIN in the last 168 hours. No results for input(s): LIPASE, AMYLASE in the last 168 hours. No results for input(s): AMMONIA in the last 168 hours. Coagulation Profile: No results for input(s): INR, PROTIME in the last 168 hours. Cardiac  Enzymes:  Recent Labs Lab 01/29/17 0012 01/29/17 0410 01/30/17 0346  TROPONINI <0.03 <0.03 0.03*   BNP (last 3 results) No results for input(s): PROBNP in the last 8760 hours. HbA1C: No results for input(s): HGBA1C in the last 72 hours. CBG: No results for input(s): GLUCAP in the last 168 hours. Lipid Profile: No results for input(s): CHOL, HDL, LDLCALC, TRIG, CHOLHDL, LDLDIRECT in the last 72 hours. Thyroid Function Tests: No results for input(s): TSH, T4TOTAL, FREET4, T3FREE, THYROIDAB in the last 72 hours. Anemia Panel:  Recent Labs  01/30/17 0339  FERRITIN 141  TIBC 224*  IRON 22*   Sepsis Labs: No results for input(s): PROCALCITON, LATICACIDVEN in the last 168 hours.  Recent Results (from the past 240 hour(s))  MRSA PCR Screening     Status: None   Collection Time: 01/28/17  8:09 PM  Result Value Ref Range Status   MRSA by PCR NEGATIVE NEGATIVE Final    Comment:        The GeneXpert MRSA Assay (FDA approved for NASAL specimens only), is one component of a comprehensive MRSA colonization surveillance program. It is not intended to diagnose MRSA infection nor to guide or monitor treatment for MRSA infections.          Radiology Studies: Dg Chest 2 View  Result Date: 01/28/2017 CLINICAL DATA:  Nausea, vomiting, and diarrhea for 1 week, LEFT side chest pain for 2 days, LEFT neck pain and head pain, worsening chest pain worsened when lying flat, history hypertension EXAM: CHEST  2 VIEW COMPARISON:  04/06/2006 FINDINGS: Borderline enlargement of cardiac silhouette. Mediastinal contours and pulmonary vascularity normal. Decreased lung volumes with bibasilar atelectasis. Lungs otherwise clear. No pleural effusion or pneumothorax. Scattered degenerative disc disease changes of the thoracic spine. IMPRESSION: Low lung volumes with bibasilar atelectasis. Electronically Signed   By: Ulyses Southward M.D.   On: 01/28/2017 16:32   Ct Angio Chest Pe W Or Wo  Contrast  Result Date: 01/28/2017 CLINICAL DATA:  Chest pain, shortness of breath x2 days EXAM: CT ANGIOGRAPHY CHEST WITH CONTRAST TECHNIQUE: Multidetector CT imaging of the chest was performed using the standard protocol during bolus administration of intravenous contrast. Multiplanar CT image reconstructions and MIPs were obtained to evaluate the vascular anatomy. CONTRAST:  100 mL Isovue 370 IV COMPARISON:  Chest radiographs dated 01/28/2017 FINDINGS: Cardiovascular: Satisfactory opacification of the bilateral pulmonary arteries to the segmental level. Multiple lobar pulmonary emboli in the distal right main pulmonary artery (series 5/ image 87), extending into the upper and lower lobes, as well as the bifurcation of the left upper and lower pulmonary arteries (series 5/ image 45), and extending into the bilateral lower lobes (series 5/ images 59-60). Overall clot burden is moderate to large. RV to LV  ratio 0.97.  No evidence of right heart strain. The heart is normal in size.  No pericardial effusion. No evidence of thoracic aortic aneurysm or dissection. Mediastinum/Nodes: No suspicious mediastinal lymphadenopathy. Lungs/Pleura: Multifocal subpleural patchy opacities in the posterior right middle lobe (series 7/image 67, lingula (series 7/image 32), and left lower lobe (series 7/image 36). In this clinical setting, this appearance is worrisome for early/ developing pulmonary infarcts. Additional linear scarring/ atelectasis in the left upper lobe. Mild patchy left lower lobe opacity, likely atelectasis. Trace left pleural effusion. No pneumothorax. Upper Abdomen: Visualized upper abdomen is notable for cholecystectomy clips. Musculoskeletal: Degenerative changes of the visualized thoracolumbar spine. Review of the MIP images confirms the above findings. IMPRESSION: Multifocal lobar pulmonary emboli in the bilateral upper and lower lobes, as described above. Overall clot burden is moderate to large. No  evidence of right heart strain. Multifocal patchy subpleural opacities in the right middle lobe, lingula and bilateral lower lobes, worrisome for early developing pulmonary infarcts. Trace left pleural effusion. Critical Value/emergent results were called by telephone at the time of interpretation on 01/28/2017 at 5:56 pm to Dr. Virgina NorfolkADAM CURATOLO, who verbally acknowledged these results. Electronically Signed   By: Charline BillsSriyesh  Krishnan M.D.   On: 01/28/2017 17:58        Scheduled Meds: . aspirin EC  81 mg Oral Daily  . atorvastatin  40 mg Oral q1800  . busPIRone  5 mg Oral BID  . cholecalciferol  1,000 Units Oral Daily  . dicyclomine  10 mg Oral TID AC  . DULoxetine  60 mg Oral QHS  . levothyroxine  200 mcg Oral QAC breakfast  . mirabegron ER  50 mg Oral QHS  . multivitamin with minerals  1 tablet Oral Daily  . pantoprazole  40 mg Oral QPM  . pramipexole  0.75 mg Oral 2 times per day  . sodium chloride flush  3 mL Intravenous Q12H  . vitamin C  500 mg Oral Daily   Continuous Infusions: . heparin 1,900 Units/hr (01/29/17 2051)     LOS: 2 days    Time spent: 35 mins     Richarda OverlieNayana Kole Hilyard, MD [facility] Pager  [phone]   If 7PM-7AM, please contact night-coverage [email or web address] Password [email or web address] 01/30/2017, 9:02 AM

## 2017-01-30 NOTE — Care Management (Addendum)
Xarelto Benefit Check 01/30/17  1. XARELTO  15 MG DAILY   COVER- YES  CO-PAY- 20 % OF TOTAL COAST  PRIOR APPROVAL-NO   2. XARELTO  20 MG DAILY   COVER- YES  CO-PAY- 20 % OF TOTAL COAST  PRIOR APPROVAL- NO   PHARMACY : [facility]   Pt transferred from 4NP to 5W prior to benefit check being resulted.  5W CM made aware of benefit check result and will provide free 30 day card and provide copay amount to pt

## 2017-01-31 DIAGNOSIS — I2699 Other pulmonary embolism without acute cor pulmonale: Principal | ICD-10-CM

## 2017-01-31 LAB — COMPREHENSIVE METABOLIC PANEL
ALK PHOS: 72 U/L (ref 38–126)
ALT: 20 U/L (ref 14–54)
ANION GAP: 9 (ref 5–15)
AST: 20 U/L (ref 15–41)
Albumin: 3 g/dL — ABNORMAL LOW (ref 3.5–5.0)
BUN: 8 mg/dL (ref 6–20)
CALCIUM: 8.8 mg/dL — AB (ref 8.9–10.3)
CO2: 27 mmol/L (ref 22–32)
CREATININE: 0.79 mg/dL (ref 0.44–1.00)
Chloride: 103 mmol/L (ref 101–111)
Glucose, Bld: 92 mg/dL (ref 65–99)
Potassium: 3.8 mmol/L (ref 3.5–5.1)
Sodium: 139 mmol/L (ref 135–145)
TOTAL PROTEIN: 6.4 g/dL — AB (ref 6.5–8.1)
Total Bilirubin: 0.5 mg/dL (ref 0.3–1.2)

## 2017-01-31 LAB — CBC
HEMATOCRIT: 36.3 % (ref 36.0–46.0)
Hemoglobin: 11.3 g/dL — ABNORMAL LOW (ref 12.0–15.0)
MCH: 26.7 pg (ref 26.0–34.0)
MCHC: 31.1 g/dL (ref 30.0–36.0)
MCV: 85.6 fL (ref 78.0–100.0)
Platelets: 232 10*3/uL (ref 150–400)
RBC: 4.24 MIL/uL (ref 3.87–5.11)
RDW: 16.5 % — AB (ref 11.5–15.5)
WBC: 8.4 10*3/uL (ref 4.0–10.5)

## 2017-01-31 LAB — HEPARIN LEVEL (UNFRACTIONATED): Heparin Unfractionated: 0.43 IU/mL (ref 0.30–0.70)

## 2017-01-31 MED ORDER — RIVAROXABAN 15 MG PO TABS: 15.0000 mg | ORAL_TABLET | Freq: Two times a day (BID) | ORAL | 0 refills | Status: DC

## 2017-01-31 MED ORDER — RIVAROXABAN 20 MG PO TABS: 20.0000 mg | ORAL_TABLET | Freq: Every day | ORAL | 6 refills | Status: DC

## 2017-01-31 MED ORDER — HYDROCODONE-ACETAMINOPHEN 5-325 MG PO TABS: 1.0000 | ORAL_TABLET | Freq: Four times a day (QID) | ORAL | 0 refills | Status: DC | PRN

## 2017-01-31 MED ORDER — POLYETHYLENE GLYCOL 3350 17 G PO PACK: 17.0000 g | PACK | Freq: Every day | ORAL | 0 refills | Status: AC | PRN

## 2017-01-31 MED ORDER — RIVAROXABAN 20 MG PO TABS: 20.0000 mg | ORAL_TABLET | Freq: Every day | ORAL | Status: DC

## 2017-01-31 MED ORDER — KETOROLAC TROMETHAMINE 30 MG/ML IJ SOLN
60.0000 mg | Freq: Once | INTRAMUSCULAR | Status: AC
  Administered 2017-01-31: 60 mg via INTRAVENOUS
  Filled 2017-01-31: qty 2

## 2017-01-31 MED ORDER — BISACODYL 5 MG PO TBEC: 5.0000 mg | DELAYED_RELEASE_TABLET | Freq: Every day | ORAL | Status: DC | PRN

## 2017-01-31 MED ORDER — LORATADINE 10 MG PO TABS
10.0000 mg | ORAL_TABLET | Freq: Once | ORAL | Status: AC
  Administered 2017-01-31: 10 mg via ORAL
  Filled 2017-01-31: qty 1

## 2017-01-31 MED ORDER — POLYETHYLENE GLYCOL 3350 17 G PO PACK
17.0000 g | PACK | Freq: Two times a day (BID) | ORAL | Status: DC
  Administered 2017-01-31 – 2017-02-01 (×2): 17 g via ORAL
  Filled 2017-01-31 (×3): qty 1

## 2017-01-31 MED ORDER — RIVAROXABAN 15 MG PO TABS
15.0000 mg | ORAL_TABLET | Freq: Two times a day (BID) | ORAL | Status: DC
  Administered 2017-01-31 – 2017-02-01 (×3): 15 mg via ORAL
  Filled 2017-01-31 (×3): qty 1

## 2017-01-31 MED ORDER — METOPROLOL SUCCINATE ER 50 MG PO TB24: 50.0000 mg | ORAL_TABLET | Freq: Every day | ORAL | 0 refills | Status: AC

## 2017-01-31 NOTE — Care Management Important Message (Signed)
Important Message  Patient Details  Name: Felicia StallWendy H Vandall MRN: [id number] Date of Birth: 14-Jan-1971   Medicare Important Message Given:  Yes    Omnia Dollinger Abena 01/31/2017, 10:31 AM

## 2017-01-31 NOTE — Progress Notes (Signed)
SATURATION QUALIFICATIONS: (This note is used to comply with regulatory documentation for home oxygen)  Patient Saturations on Room Air at Rest = 84%  Patient Saturations on 3L at rest = 91%  Patient Saturations on 4 Liters of oxygen while Ambulating = 90%  Please briefly explain why patient needs home oxygen: Pt desaturating on RA at rest and requiring 4L with gait to maintain 90% Delaney MeigsMaija Tabor Falana Clagg, PT [phone]

## 2017-01-31 NOTE — Discharge Instructions (Addendum)
Information on my medicine - ELIQUIS (apixaban)  This medication education was reviewed with me or my healthcare representative as part of my discharge preparation.  The pharmacist that spoke with me during my hospital stay was: Rolley Sims, [facility]  Why was Eliquis prescribed for you? Eliquis was prescribed to treat blood clots that may have been found in the veins of your legs (deep vein thrombosis) or in your lungs (pulmonary embolism) and to reduce the risk of them occurring again.  What do You need to know about Eliquis ? The starting dose is 10 mg (two 5 mg tablets) taken TWICE daily for the FIRST SEVEN (7) DAYS, then on JUNE 1st 2018 the dose is reduced to ONE 5 mg tablet taken TWICE daily.  Eliquis may be taken with or without food.   Try to take the dose about the same time in the morning and in the evening. If you have difficulty swallowing the tablet whole please discuss with your pharmacist how to take the medication safely.  Take Eliquis exactly as prescribed and DO NOT stop taking Eliquis without talking to the doctor who prescribed the medication.  Stopping may increase your risk of developing a new blood clot.  Refill your prescription before you run out.  After discharge, you should have regular check-up appointments with your healthcare provider that is prescribing your Eliquis.    What do you do if you miss a dose? If a dose of ELIQUIS is not taken at the scheduled time, take it as soon as possible on the same day and twice-daily administration should be resumed. The dose should not be doubled to make up for a missed dose.  Important Safety Information A possible side effect of Eliquis is bleeding. You should call your healthcare provider right away if you experience any of the following: ? Bleeding from an injury or your nose that does not stop. ? Unusual colored urine (red or dark brown) or unusual colored stools (red or black). ? Unusual bruising for  unknown reasons. ? A serious fall or if you hit your head (even if there is no bleeding).  Some medicines may interact with Eliquis and might increase your risk of bleeding or clotting while on Eliquis. To help avoid this, consult your healthcare provider or pharmacist prior to using any new prescription or non-prescription medications, including herbals, vitamins, non-steroidal anti-inflammatory drugs (NSAIDs) and supplements.  This website has more information on Eliquis (apixaban): http://www.eliquis.com/eliquis/home      Deep Vein Thrombosis A deep vein thrombosis (DVT) is a blood clot (thrombus) that usually occurs in a deep, larger vein of the lower leg or the pelvis, or in an upper extremity such as the arm. These are dangerous and can lead to serious and even life-threatening complications if the clot travels to the lungs. A DVT can damage the valves in your leg veins so that instead of flowing upward, the blood pools in the lower leg. This is called post-thrombotic syndrome, and it can result in pain, swelling, discoloration, and sores on the leg. What are the causes? A DVT is caused by the formation of a blood clot in your leg, pelvis, or arm. Usually, several things contribute to the formation of blood clots. A clot may develop when:  Your blood flow slows down.  Your vein becomes damaged in some way.  You have a condition that makes your blood clot more easily. What increases the risk? A DVT is more likely to develop in:  People  who are older, especially over 21 years of age.  People who are overweight (obese).  People who sit or lie still for a long time, such as during long-distance travel (over 4 hours), bed rest, hospitalization, or during recovery from certain medical conditions like a stroke.  People who do not engage in much physical activity (sedentary lifestyle).  People who have chronic breathing disorders.  People who have a personal or family history of  blood clots or blood clotting disease.  People who have peripheral vascular disease (PVD), diabetes, or some types of cancer.  People who have heart disease, especially if the person had a recent heart attack or has congestive heart failure.  People who have neurological diseases that affect the legs (leg paresis).  People who have had a traumatic injury, such as breaking a hip or leg.  People who have recently had major or lengthy surgery, especially on the hip, knee, or abdomen.  People who have had a central line placed inside a large vein.  People who take medicines that contain the hormone estrogen. These include birth control pills and hormone replacement therapy.  Pregnancy or during childbirth or the postpartum period.  Long plane flights (over 8 hours). What are the signs or symptoms?   Symptoms of a DVT can include:  Swelling of your leg or arm, especially if one side is much worse.  Warmth and redness of your leg or arm, especially if one side is much worse.  Pain in your arm or leg. If the clot is in your leg, symptoms may be more noticeable or worse when you stand or walk.  A feeling of pins and needles, if the clot is in the arm. The symptoms of a DVT that has traveled to the lungs (pulmonary embolism, PE) usually start suddenly and include:  Shortness of breath while active or at rest.  Coughing or coughing up blood or blood-tinged mucus.  Chest pain that is often worse with deep breaths.  Rapid or irregular heartbeat.  Feeling light-headed or dizzy.  Fainting.  Feeling anxious.  Sweating. There may also be pain and swelling in a leg if that is where the blood clot started. These symptoms may represent a serious problem that is an emergency. Do not wait to see if the symptoms will go away. Get medical help right away. Call your local emergency services (911 in the [place] Do not drive yourself to the hospital.  How is this diagnosed? Your health care  provider will take a medical history and perform a physical exam. You may also have other tests, including:  Blood tests to assess the clotting properties of your blood.  Imaging tests, such as CT, ultrasound, MRI, X-ray, and other tests to see if you have clots anywhere in your body. How is this treated? After a DVT is identified, it can be treated. The type of treatment that you receive depends on many factors, such as the cause of your DVT, your risk for bleeding or developing more clots, and other medical conditions that you have. Sometimes, a combination of treatments is necessary. Treatment options may be combined and include:  Monitoring the blood clot with ultrasound.  Taking medicines by mouth, such as newer blood thinners (anticoagulants), thrombolytics, or warfarin.  Taking anticoagulant medicine by injection or through an IV tube.  Wearing compression stockings or using different types ofdevices.  Surgery (rare) to remove the blood clot or to place a filter in your abdomen to stop the blood clot  from traveling to your lungs. Treatments for a DVT are often divided into immediate treatment and long-term treatment (up to 3 months after DVT). You can work with your health care provider to choose the treatment program that is best for you. Follow these instructions at home: If you are taking a newer oral anticoagulant:   Take the medicine every single day at the same time each day.  Understand what foods and drugs interact with this medicine.  Understand that there are no regular blood tests required when using this medicine.  Understand the side effects of this medicine, including excessive bruising or bleeding. Ask your health care provider or pharmacist about other possible side effects. If you are taking warfarin:   Understand how to take warfarin and know which foods can affect how warfarin works in Public relations account executiveyour body.  Understand that it is dangerous to take too much or too  little warfarin. Too much warfarin increases the risk of bleeding. Too little warfarin continues to allow the risk for blood clots.  Follow your PT and INR blood testing schedule. The PT and INR results allow your health care provider to adjust your dose of warfarin. It is very important that you have your PT and INR tested as often as told by your health care provider.  Avoid major changes in your diet, or tell your health care provider before you change your diet. Arrange a visit with a registered dietitian to answer your questions. Many foods, especially foods that are high in vitamin K, can interfere with warfarin and affect the PT and INR results. Eat a consistent amount of foods that are high in vitamin K, such as:  Spinach, kale, broccoli, cabbage, collard greens, turnip greens, Brussels sprouts, peas, cauliflower, seaweed, and parsley.  Beef liver and pork liver.  Green tea.  Soybean oil.  Tell your health care provider about any and all medicines, vitamins, and supplements that you take, including aspirin and other over-the-counter anti-inflammatory medicines. Be especially cautious with aspirin and anti-inflammatory medicines. Do not take those before you ask your health care provider if it is safe to do so. This is important because many medicines can interfere with warfarin and affect the PT and INR results.  Do not start or stop taking any over-the-counter or prescription medicine unless your health care provider or pharmacist tells you to do so. If you take warfarin, you will also need to do these things:  Hold pressure over cuts for longer than usual.  Tell your dentist and other health care providers that you are taking warfarin before you have any procedures in which bleeding may occur.  Avoid alcohol or drink very small amounts. Tell your health care provider if you change your alcohol intake.  Do not use tobacco products, including cigarettes, chewing tobacco, and  e-cigarettes. If you need help quitting, ask your health care provider.  Avoid contact sports. General instructions   Take over-the-counter and prescription medicines only as told by your health care provider. Anticoagulant medicines can have side effects, including easy bruising and difficulty stopping bleeding. If you are prescribed an anticoagulant, you will also need to do these things:  Hold pressure over cuts for longer than usual.  Tell your dentist and other health care providers that you are taking anticoagulants before you have any procedures in which bleeding may occur.  Avoid contact sports.  Wear a medical alert bracelet or carry a medical alert card that says you have had a PE.  Ask your health  care provider how soon you can go back to your normal activities. Stay active to prevent new blood clots from forming.  Make sure to exercise while traveling or when you have been sitting or standing for a long period of time. It is very important to exercise. Exercise your legs by walking or by tightening and relaxing your leg muscles often. Take frequent walks.  Wear compression stockings as told by your health care provider to help prevent more blood clots from forming.  Do not use tobacco products, including cigarettes, chewing tobacco, and e-cigarettes. If you need help quitting, ask your health care provider.  Keep all follow-up appointments with your health care provider. This is important. How is this prevented? Take these actions to decrease your risk of developing another DVT:  Exercise regularly. For at least 30 minutes every day, engage in:  Activity that involves moving your arms and legs.  Activity that encourages good blood flow through your body by increasing your heart rate.  Exercise your arms and legs every hour during long-distance travel (over 4 hours). Drink plenty of water and avoid drinking alcohol while traveling.  Avoid sitting or lying in bed for long  periods of time without moving your legs.  Maintain a weight that is appropriate for your height. Ask your health care provider what weight is healthy for you.  If you are a woman who is over 48 years of age, avoid unnecessary use of medicines that contain estrogen. These include birth control pills.  Do not smoke, especially if you take estrogen medicines. If you need help quitting, ask your health care provider. If you are hospitalized, prevention measures may include:  Early walking after surgery, as soon as your health care provider says that it is safe.  Receiving anticoagulants to prevent blood clots.If you cannot take anticoagulants, other options may be available, such as wearing compression stockings or using different types of devices. Get help right away if:  You have new or increased pain, swelling, or redness in an arm or leg.  You have numbness or tingling in an arm or leg.  You have shortness of breath while active or at rest.  You have chest pain.  You have a rapid or irregular heartbeat.  You feel light-headed or dizzy.  You cough up blood.  You notice blood in your vomit, bowel movement, or urine. These symptoms may represent a serious problem that is an emergency. Do not wait to see if the symptoms will go away. Get medical help right away. Call your local emergency services (911 in the [place] Do not drive yourself to the hospital. This information is not intended to replace advice given to you by your health care provider. Make sure you discuss any questions you have with your health care provider. Document Released: 08/27/2005 Document Revised: 02/02/2016 Document Reviewed: 12/22/2014 Elsevier Interactive Patient Education  2017 [employer] Inc[employer]   Home Oxygen Use, Adult When a medical condition keeps you from getting enough oxygen, your health care provider may instruct you to take extra oxygen at home. Your health care provider will let you know:  When to  take oxygen.  For how long to take oxygen.  How quickly oxygen should be delivered (flow rate), in liters per minute (LPM or L/M). Home oxygen can be given through:  A mask.  A nasal cannula. This is a device or tube that goes in the nostrils.  A transtracheal catheter. This is a small, flexible tube placed in the trachea.  A tracheostomy. This is a surgically made opening in the trachea. These devices are connected with tubing to an oxygen source, such as:  A tank. Tanks hold oxygen in gas form. They must be replaced when the oxygen is used up.  A liquid oxygen device. This holds oxygen in liquid form. It must be replaced when the oxygen is used up.  An oxygen concentrator machine. This filters oxygen in the room. It uses electricity, so you must have a backup cylinder of oxygen in case the power goes out. Supplies needed: To use oxygen, you will need:  A mask, nasal cannula, transtracheal catheter, or tracheostomy.  An oxygen tank, a liquid oxygen device, or an oxygen concentrator.  The tape that your health care provider recommends (optional). If you use a transtracheal catheter and your prescribed flow rate is 1 LPM or greater, you will also need a humidifier. Risks and complications  Fire. This can happen if the oxygen is exposed to a heat source, flame, or spark.  Injury to skin. This can happen if liquid oxygen touches your skin.  Organ damage. This can happen if you get too little oxygen. How to use oxygen Your health care provider will show you how to use your oxygen device. Follow her or his instructions. They may look something like this: 1. Wash your hands. 2. If you use an oxygen concentrator, make sure it is plugged in. 3. Place one end of the tube into the port on the tank, device, or machine. 4. Place the mask over your nose and mouth. Or, place the nasal cannula and secure it with tape if instructed. If you use a tracheostomy or transtracheal catheter,  connect it to the oxygen source as directed. 5. Make sure the liter-flow setting on the machine is at the level prescribed by your health care provider. 6. Turn on the machine or adjust the knob on the tank or device to the correct liter-flow setting. 7. When you are done, turn off and unplug the machine, or turn the knob to OFF. How to clean and care for the oxygen supplies Nasal cannula   Clean it with a warm, wet cloth daily or as needed.  Wash it with a liquid soap once a week.  Rinse it thoroughly once or twice a week.  Replace it every 2-4 weeks.  If you have an infection, such as a cold or pneumonia, change the cannula when you get better. Mask   Replace it every 2-4 weeks.  If you have an infection, such as a cold or pneumonia, change the mask when you get better. Humidifier bottle   Wash the bottle between each refill:  Wash it with soap and warm water.  Rinse it thoroughly.  Disinfect it and its top.  Air-dry it.  Make sure it is dry before you refill it. Oxygen concentrator   Clean the air filter at least twice a week according to directions from your home medical equipment and service company.  Wipe down the cabinet every day. To do this:  Unplug the unit.  Wipe down the cabinet with a damp cloth.  Dry the cabinet. Other equipment   Change any extra tubing every 1-3 months.  Follow instructions from your health care provider about taking care of any other equipment. Safety tips Fire safety tips    Keep your oxygen and oxygen supplies at least 5 ft away from sources of heat, flames, and sparks at all times.  Do not allow smoking near  your oxygen. Put up "no smoking" signs in your home.  Do not use materials that can burn (are flammable) while you use oxygen.  When you go to a restaurant with portable oxygen, ask to be seated in the nonsmoking section.  Keep a Government social research officer close by. Let your fire department know that you have oxygen in your  home.  Test your home smoke detectors regularly. General safety tips   If you use an oxygen cylinder, make sure it is in a stand or secured to an object that will not move (fixed object).  If you use liquid oxygen, make sure its container is kept upright.  If you use an oxygen concentrator:  Tell Museum/gallery curator company. Make sure you are given priority service in the event that your power goes out.  Avoid using extension cords, if possible. Follow these instructions at home:  Use oxygen only as told by your health care provider.  Do not use alcohol or other drugs that make you relax (sedating drugs) unless instructed. They can slow down your breathing rate and make it hard to get in enough oxygen.  Know how and when to order a refill of oxygen.  Always keep a spare tank of oxygen. Plan ahead for holidays when you may not be able to get a prescription filled.  Use water-based lubricants on your lips or nostrils. Do not use oil-based products like petroleum jelly.  To prevent skin irritation on your cheeks or behind your ears, tuck some gauze under the tubing. Contact a health care provider if:  You get headaches often.  You have shortness of breath.  You have a lasting cough.  You have anxiety.  You are sleepy all the time.  You develop an illness that affects your breathing.  You cannot exercise at your regular level.  You are restless.  You have difficult or irregular breathing, and it is getting worse.  You have a fever.  You have persistent redness under your nose. Get help right away if:  You are confused.  You have blue lips or fingernails.  You are struggling to breathe. This information is not intended to replace advice given to you by your health care provider. Make sure you discuss any questions you have with your health care provider. Document Released: 11/17/2003 Document Revised: 04/25/2016 Document Reviewed: 03/20/2016 Elsevier Interactive  Patient Education  2017 [employer].

## 2017-01-31 NOTE — Progress Notes (Addendum)
ANTICOAGULATION CONSULT NOTE - Follow Up Consult  Pharmacy Consult for Heparin >> Xarelto (see addendum) Indication: pulmonary embolus  Patient Measurements: Height: 5\' 2"  (157.5 cm) Weight: 280 lb (127 kg) IBW/kg (Calculated) : 50.1 Heparin Dosing Weight: 84 kg  Vital Signs: Temp: 98.3 F (36.8 C) (05/24 0520) Temp Source: Oral (05/23 2200) BP: 138/71 (05/24 0520) Pulse Rate: 71 (05/24 0520)  Labs:  Recent Labs  01/28/17 1702  01/29/17 0012 01/29/17 0410 01/29/17 1950 01/30/17 0339 01/30/17 0346 01/31/17 0538  HGB 12.4  --   --  11.3*  --  11.5*  --  11.3*  HCT 39.5  --   --  37.4  --  37.8  --  36.3  PLT 289  --   --  268  --  262  --  232  HEPARINUNFRC  --   < > 0.21*  --  0.31 0.51  --  0.43  CREATININE 0.95  --   --  0.83  --   --   --  0.79  TROPONINI  --   --  <0.03 <0.03  --   --  0.03*  --   < > = values in this interval not displayed.  Estimated Creatinine Clearance: 113.4 mL/min (by C-G formula based on SCr of 0.79 mg/dL).   Assessment: 46 yo female with PE no RH strain is currently on heparin for anticoagulation.   Heparin level this morning remains therapeutic (HL 0.43 << 0.51, goal of 0.3-0.7). Hgb low but stable, plts wnl. No overt s/sx of bleeding noted.   Addendum: Plans are now to transition to Xarelto this morning. Transition plans were discussed with the RN Denny Peon(Erin).  Goal of Therapy:  Heparin level 0.3-0.7 units/ml Monitor platelets by anticoagulation protocol: Yes   Plan:  1. Continue Heparin at 1900 units/hr (19 ml/hr) 2. Will follow-up plans to transition to oral anticoagulation 3. Will continue to monitor for any signs/symptoms of bleeding and will follow up with heparin level in the a.m.   Addendum: 1. D/c Heparin drip 2. Start Xarelto 15 mg bid with meals x 21 days (thru 6/13) 3. On 6/14, transition to Xarelto 20 mg daily with supper 4. Pharmacy will provide education prior to discharge  Thank you for allowing pharmacy to be a  part of this patient's care.  Georgina PillionElizabeth Tyona Nilsen, PharmD, BCPS Clinical Pharmacist Pager: [phone] Clinical phone for 01/31/2017 from 7a-3:30p: [phone] If after 3:30p, please call main pharmacy at: [phone] 01/31/2017 9:16 AM

## 2017-01-31 NOTE — Evaluation (Signed)
Physical Therapy Evaluation Patient Details Name: Felicia King MRN: [id number] DOB: 1970/10/03 Today's Date: 01/31/2017   History of Present Illness  46 yo admitted with PE and LLE DVT. PMHx: HTN, BP  Clinical Impression  Pt pleasant and reports chest pain periodically at rest creating nausea. Pt on 4L on arrival with sats 94%. Attempted RA with sats dropping to 83% and O2 reapplied. Pt with decreased strength, transfers, gait and function all limited by pain and SOB who will benefit from acute therapy to maximize mobility, gait and independence to decrease burden of care. Pt has flight of stairs to her bedroom and could not functionally perform this task yet. Encouraged mobility and ambulation with nursing with O2 acutely.   84% on RA 91% on 2L at rest 90% on 4L with gait  HR 83-90    Follow Up Recommendations Home health PT    Equipment Recommendations  Rolling walker with 5" wheels    Recommendations for Other Services       Precautions / Restrictions Precautions Precautions: Fall Precaution Comments: watch sats Restrictions Weight Bearing Restrictions: No      Mobility  Bed Mobility Overal bed mobility: Modified Independent             General bed mobility comments: with rail and increased effort  Transfers Overall transfer level: Modified independent               General transfer comment: from bed and toilet with grab bar  Ambulation/Gait Ambulation/Gait assistance: Supervision Ambulation Distance (Feet): 120 Feet Assistive device: Rolling walker (2 wheeled) Gait Pattern/deviations: Step-through pattern;Decreased stride length Gait velocity: 22/42=.52 Gait velocity interpretation: <1.8 ft/sec, indicative of risk for recurrent falls General Gait Details: slow gait with reliance on RW due to periodic stabbing chest pain with activity as well as pt reporting that her legs feel like lead with walking and are difficult to move. Cues for RW use and  breathing technique  Stairs            Wheelchair Mobility    Modified Rankin (Stroke Patients Only)       Balance                                             Pertinent Vitals/Pain Pain Assessment: 0-10 Pain Score: 5  Pain Location: left chest  Pain Descriptors / Indicators: Stabbing Pain Intervention(s): Limited activity within patient's tolerance;Monitored during session    Home Living Family/patient expects to be discharged to:: Private residence Living Arrangements: Spouse/significant other;Children Available Help at Discharge: Family;Available PRN/intermittently Type of Home: House Home Access: Level entry     Home Layout: Two level;Bed/bath upstairs Home Equipment: None      Prior Function Level of Independence: Independent         Comments: pt lives with fiance and 148 yo son. works from home part-time     Higher education careers adviserHand Dominance        Extremity/Trunk Assessment   Upper Extremity Assessment Upper Extremity Assessment: Generalized weakness    Lower Extremity Assessment Lower Extremity Assessment: Generalized weakness (ROM limited by body habitus)    Cervical / Trunk Assessment Cervical / Trunk Assessment: Kyphotic  Communication   Communication: No difficulties  Cognition Arousal/Alertness: Awake/alert Behavior During Therapy: WFL for tasks assessed/performed Overall Cognitive Status: Within Functional Limits for tasks assessed  General Comments      Exercises     Assessment/Plan    PT Assessment Patient needs continued PT services  PT Problem List Decreased strength;Decreased mobility;Decreased activity tolerance;Cardiopulmonary status limiting activity;Decreased knowledge of use of DME       PT Treatment Interventions DME instruction;Therapeutic activities;Gait training;Stair training;Functional mobility training;Patient/family education    PT Goals (Current  goals can be found in the Care Plan section)  Acute Rehab PT Goals Patient Stated Goal: return home PT Goal Formulation: With patient Time For Goal Achievement: 02/07/17 Potential to Achieve Goals: Good    Frequency Min 3X/week   Barriers to discharge Decreased caregiver support fiance works and unavailable during the day    Co-evaluation               AM-PAC PT "6 Clicks" Daily Activity  Outcome Measure Difficulty turning over in bed (including adjusting bedclothes, sheets and blankets)?: A Little Difficulty moving from lying on back to sitting on the side of the bed? : A Lot Difficulty sitting down on and standing up from a chair with arms (e.g., wheelchair, bedside commode, etc,.)?: A Little Help needed moving to and from a bed to chair (including a wheelchair)?: A Little Help needed walking in hospital room?: A Little Help needed climbing 3-5 steps with a railing? : A Lot 6 Click Score: 16    End of Session Equipment Utilized During Treatment: Oxygen Activity Tolerance: Patient limited by pain Patient left: in chair;with call bell/phone within reach Nurse Communication: Mobility status PT Visit Diagnosis: Other abnormalities of gait and mobility (R26.89);Difficulty in walking, not elsewhere classified (R26.2)    Time: 1610-9604 PT Time Calculation (min) (ACUTE ONLY): 31 min   Charges:   PT Evaluation $PT Eval Moderate Complexity: 1 Procedure PT Treatments $Gait Training: 8-22 mins   PT G Codes:        Delaney Meigs, PT [phone]   Khaliq Turay B Osher Oettinger 01/31/2017, 12:46 PM

## 2017-01-31 NOTE — Discharge Summary (Addendum)
Physician Discharge Summary  Felicia King MRN: [id number] DOB/AGE: 46-15-1972 46 y.o.  PCP: Marijo File, MD   Admit date: 01/28/2017 Discharge date: 01/31/2017  Discharge Diagnoses:    Principal Problem:   Acute pulmonary embolism [facility]) Active Problems:   Essential hypertension   Hypothyroidism   Chest pain  Addendum Still needing 3-4 L , will hold dc today  Patient Saturations on Room Air at Rest = 84%  Patient Saturations on 3L at rest = 91%  Patient Saturations on 4 Liters of oxygen while Ambulating = 90%  Follow-up recommendations Follow-up with PCP in 3-5 days , including all  additional recommended appointments as below Follow-up CBC, CMP in 3-5 days       Current Discharge Medication List    START taking these medications   Details  HYDROcodone-acetaminophen (NORCO/VICODIN) 5-325 MG tablet Take 1 tablet by mouth every 6 (six) hours as needed for moderate pain. Qty: 30 tablet, Refills: 0    polyethylene glycol (MIRALAX / GLYCOLAX) packet Take 17 g by mouth daily as needed for mild constipation. Qty: 14 each, Refills: 0    !! Rivaroxaban (XARELTO) 15 MG TABS tablet Take 1 tablet (15 mg total) by mouth 2 (two) times daily with a meal. Qty: 42 tablet, Refills: 0    !! rivaroxaban (XARELTO) 20 MG TABS tablet Take 1 tablet (20 mg total) by mouth daily with supper. Qty: 30 tablet, Refills: 6     !! - Potential duplicate medications found. Please discuss with provider.    CONTINUE these medications which have NOT CHANGED   Details  aspirin EC 81 MG tablet Take 81-162 mg by mouth See admin instructions. Take 1 tablet (81 mg) by mouth every morning, may take 2 tablets (162) if in pain when waking up    atorvastatin (LIPITOR) 40 MG tablet Take 40 mg by mouth at bedtime. For high cholesterol    baclofen (LIORESAL) 20 MG tablet Take 20 mg by mouth 3 (three) times daily as needed for muscle spasms.    busPIRone (BUSPAR) 5 MG tablet Take 5 mg by mouth 2  (two) times daily. For anxiety    cholecalciferol (VITAMIN D) 1000 units tablet Take 1,000 Units by mouth daily.    Diclofenac Sodium 1.5 % SOLN Place 10 drops onto the skin 2 (two) times daily as needed (neck, shoulders and chest pain).    dicyclomine (BENTYL) 10 MG capsule Take 10 mg by mouth 3 (three) times daily before meals.    DULoxetine (CYMBALTA) 60 MG capsule Take 60 mg by mouth at bedtime. For depression    fluticasone (FLONASE) 50 MCG/ACT nasal spray Place 1 spray into both nostrils at bedtime as needed for allergies or rhinitis.     gabapentin (NEURONTIN) 300 MG capsule Take 300 mg by mouth 3 (three) times daily as needed (nerve pain).     levothyroxine (SYNTHROID, LEVOTHROID) 200 MCG tablet Take 200 mcg by mouth daily.    metoprolol succinate (TOPROL-XL) 50 MG 24 hr tablet Take 50 mg by mouth daily. For high blood pressure    mirabegron ER (MYRBETRIQ) 50 MG TB24 tablet Take 50 mg by mouth at bedtime. For bladder incontinence    Multiple Vitamin (MULTIVITAMIN WITH MINERALS) TABS tablet Take 1 tablet by mouth daily. Daily Women's Energy Vitamin    omeprazole (PRILOSEC) 20 MG capsule Take 20 mg by mouth at bedtime. For stomach acid    pramipexole (MIRAPEX) 1.5 MG tablet Take 0.75 mg by mouth See admin instructions. Take  1/2 tablet (0.75 mg) by mouth every evening (4p-5p) and at bedtime - for restless legs    SUMAtriptan (IMITREX) 50 MG tablet Take 50 mg by mouth every 2 (two) hours as needed for migraine. May repeat in 2 hours if headache persists or recurs - max 3 tablets per week    vitamin C (ASCORBIC ACID) 500 MG tablet Take 500 mg by mouth daily.         Discharge Condition: Stable   Discharge Instructions Get Medicines reviewed and adjusted: Please take all your medications with you for your next visit with your Primary MD  Please request your Primary MD to go over all hospital tests and procedure/radiological results at the follow up, please ask your Primary  MD to get all Hospital records sent to his/her office.  If you experience worsening of your admission symptoms, develop shortness of breath, life threatening emergency, suicidal or homicidal thoughts you must seek medical attention immediately by calling 911 or calling your MD immediately if symptoms less severe.  You must read complete instructions/literature along with all the possible adverse reactions/side effects for all the Medicines you take and that have been prescribed to you. Take any new Medicines after you have completely understood and accpet all the possible adverse reactions/side effects.   Do not drive when taking Pain medications.   Do not take more than prescribed Pain, Sleep and Anxiety Medications  Special Instructions: If you have smoked or chewed Tobacco in the last 2 yrs please stop smoking, stop any regular Alcohol and or any Recreational drug use.  Wear Seat belts while driving.  Please note  You were cared for by a hospitalist during your hospital stay. Once you are discharged, your primary care physician will handle any further medical issues. Please note that NO REFILLS for any discharge medications will be authorized once you are discharged, as it is imperative that you return to your primary care physician (or establish a relationship with a primary care physician if you do not have one) for your aftercare needs so that they can reassess your need for medications and monitor your lab values.     Allergies  Allergen Reactions  . Morphine And Related Itching  . Sulfa Antibiotics Anaphylaxis      Disposition:    Consults:  None    Significant Diagnostic Studies:  Dg Chest 2 View  Result Date: 01/28/2017 CLINICAL DATA:  Nausea, vomiting, and diarrhea for 1 week, LEFT side chest pain for 2 days, LEFT neck pain and head pain, worsening chest pain worsened when lying flat, history hypertension EXAM: CHEST  2 VIEW COMPARISON:  04/06/2006 FINDINGS:  Borderline enlargement of cardiac silhouette. Mediastinal contours and pulmonary vascularity normal. Decreased lung volumes with bibasilar atelectasis. Lungs otherwise clear. No pleural effusion or pneumothorax. Scattered degenerative disc disease changes of the thoracic spine. IMPRESSION: Low lung volumes with bibasilar atelectasis. Electronically Signed   By: Ulyses Southward M.D.   On: 01/28/2017 16:32   Ct Angio Chest Pe W Or Wo Contrast  Result Date: 01/28/2017 CLINICAL DATA:  Chest pain, shortness of breath x2 days EXAM: CT ANGIOGRAPHY CHEST WITH CONTRAST TECHNIQUE: Multidetector CT imaging of the chest was performed using the standard protocol during bolus administration of intravenous contrast. Multiplanar CT image reconstructions and MIPs were obtained to evaluate the vascular anatomy. CONTRAST:  100 mL Isovue 370 IV COMPARISON:  Chest radiographs dated 01/28/2017 FINDINGS: Cardiovascular: Satisfactory opacification of the bilateral pulmonary arteries to the segmental level. Multiple lobar pulmonary  emboli in the distal right main pulmonary artery (series 5/ image 87), extending into the upper and lower lobes, as well as the bifurcation of the left upper and lower pulmonary arteries (series 5/ image 45), and extending into the bilateral lower lobes (series 5/ images 59-60). Overall clot burden is moderate to large. RV to LV ratio 0.97.  No evidence of right heart strain. The heart is normal in size.  No pericardial effusion. No evidence of thoracic aortic aneurysm or dissection. Mediastinum/Nodes: No suspicious mediastinal lymphadenopathy. Lungs/Pleura: Multifocal subpleural patchy opacities in the posterior right middle lobe (series 7/image 67, lingula (series 7/image 32), and left lower lobe (series 7/image 36). In this clinical setting, this appearance is worrisome for early/ developing pulmonary infarcts. Additional linear scarring/ atelectasis in the left upper lobe. Mild patchy left lower lobe opacity,  likely atelectasis. Trace left pleural effusion. No pneumothorax. Upper Abdomen: Visualized upper abdomen is notable for cholecystectomy clips. Musculoskeletal: Degenerative changes of the visualized thoracolumbar spine. Review of the MIP images confirms the above findings. IMPRESSION: Multifocal lobar pulmonary emboli in the bilateral upper and lower lobes, as described above. Overall clot burden is moderate to large. No evidence of right heart strain. Multifocal patchy subpleural opacities in the right middle lobe, lingula and bilateral lower lobes, worrisome for early developing pulmonary infarcts. Trace left pleural effusion. Critical Value/emergent results were called by telephone at the time of interpretation on 01/28/2017 at 5:56 pm to Dr. Lennice Sites, who verbally acknowledged these results. Electronically Signed   By: Julian Hy M.D.   On: 01/28/2017 17:58    echocardiogram  LV EF: 50% -   55%  ------------------------------------------------------------------- Indications:      Pulmonary embolus 415.19.  ------------------------------------------------------------------- History:   PMH:  No prior cardiac history.  ------------------------------------------------------------------- Study Conclusions  - Left ventricle: The cavity size was normal. There was moderate   concentric hypertrophy. Systolic function was normal. The   estimated ejection fraction was in the range of 50% to 55%. Wall   motion was normal; there were no regional wall motion   abnormalities. Left ventricular diastolic function parameters   were normal. - Aortic valve: There was no regurgitation. - Mitral valve: Transvalvular velocity was within the normal range.   There was no evidence for stenosis. There was no regurgitation.   Valve area by pressure half-time: 0.97 cm^2. - Right ventricle: The cavity size was normal. Wall thickness was   normal. Systolic function was normal. - Tricuspid valve:  There was no regurgitation.      Filed Weights   01/28/17 1800 01/30/17 1106  Weight: 133.8 kg (295 lb) 127 kg (280 lb)     Microbiology: Recent Results (from the past 240 hour(s))  MRSA PCR Screening     Status: None   Collection Time: 01/28/17  8:09 PM  Result Value Ref Range Status   MRSA by PCR NEGATIVE NEGATIVE Final    Comment:        The GeneXpert MRSA Assay (FDA approved for NASAL specimens only), is one component of a comprehensive MRSA colonization surveillance program. It is not intended to diagnose MRSA infection nor to guide or monitor treatment for MRSA infections.        Blood Culture No results found for: SDES, [place], CULT, REPTSTATUS    Labs: Results for orders placed or performed during the hospital encounter of 01/28/17 (from the past 48 hour(s))  Heparin level (unfractionated)     Status: None   Collection Time: 01/29/17  7:50  PM  Result Value Ref Range   Heparin Unfractionated 0.31 0.30 - 0.70 IU/mL    Comment:        IF HEPARIN RESULTS ARE BELOW EXPECTED VALUES, AND PATIENT DOSAGE HAS BEEN CONFIRMED, SUGGEST FOLLOW UP TESTING OF ANTITHROMBIN III LEVELS.   CBC     Status: Abnormal   Collection Time: 01/30/17  3:39 AM  Result Value Ref Range   WBC 11.4 (H) 4.0 - 10.5 K/uL   RBC 4.37 3.87 - 5.11 MIL/uL   Hemoglobin 11.5 (L) 12.0 - 15.0 g/dL   HCT 37.8 36.0 - 46.0 %   MCV 86.5 78.0 - 100.0 fL   MCH 26.3 26.0 - 34.0 pg   MCHC 30.4 30.0 - 36.0 g/dL   RDW 17.1 (H) 11.5 - 15.5 %   Platelets 262 150 - 400 K/uL  Iron and TIBC     Status: Abnormal   Collection Time: 01/30/17  3:39 AM  Result Value Ref Range   Iron 22 (L) 28 - 170 ug/dL   TIBC 224 (L) 250 - 450 ug/dL   Saturation Ratios 10 (L) 10.4 - 31.8 %   UIBC 202 ug/dL  Ferritin     Status: None   Collection Time: 01/30/17  3:39 AM  Result Value Ref Range   Ferritin 141 11 - 307 ng/mL  Heparin level (unfractionated)     Status: None   Collection Time: 01/30/17  3:39 AM   Result Value Ref Range   Heparin Unfractionated 0.51 0.30 - 0.70 IU/mL    Comment:        IF HEPARIN RESULTS ARE BELOW EXPECTED VALUES, AND PATIENT DOSAGE HAS BEEN CONFIRMED, SUGGEST FOLLOW UP TESTING OF ANTITHROMBIN III LEVELS.   Troponin I     Status: Abnormal   Collection Time: 01/30/17  3:46 AM  Result Value Ref Range   Troponin I 0.03 (HH) <0.03 ng/mL    Comment: CRITICAL RESULT CALLED TO, READ BACK BY AND VERIFIED WITH: C JONES,RN [id number] WILDERK   TSH     Status: None   Collection Time: 01/30/17  9:53 AM  Result Value Ref Range   TSH 3.826 0.350 - 4.500 uIU/mL    Comment: Performed by a 3rd Generation assay with a functional sensitivity of <=0.01 uIU/mL.  CBC     Status: Abnormal   Collection Time: 01/31/17  5:38 AM  Result Value Ref Range   WBC 8.4 4.0 - 10.5 K/uL   RBC 4.24 3.87 - 5.11 MIL/uL   Hemoglobin 11.3 (L) 12.0 - 15.0 g/dL   HCT 36.3 36.0 - 46.0 %   MCV 85.6 78.0 - 100.0 fL   MCH 26.7 26.0 - 34.0 pg   MCHC 31.1 30.0 - 36.0 g/dL   RDW 16.5 (H) 11.5 - 15.5 %   Platelets 232 150 - 400 K/uL  Heparin level (unfractionated)     Status: None   Collection Time: 01/31/17  5:38 AM  Result Value Ref Range   Heparin Unfractionated 0.43 0.30 - 0.70 IU/mL    Comment:        IF HEPARIN RESULTS ARE BELOW EXPECTED VALUES, AND PATIENT DOSAGE HAS BEEN CONFIRMED, SUGGEST FOLLOW UP TESTING OF ANTITHROMBIN III LEVELS.   Comprehensive metabolic panel     Status: Abnormal   Collection Time: 01/31/17  5:38 AM  Result Value Ref Range   Sodium 139 135 - 145 mmol/L   Potassium 3.8 3.5 - 5.1 mmol/L   Chloride 103 101 - 111 mmol/L  CO2 27 22 - 32 mmol/L   Glucose, Bld 92 65 - 99 mg/dL   BUN 8 6 - 20 mg/dL   Creatinine, Ser 0.79 0.44 - 1.00 mg/dL   Calcium 8.8 (L) 8.9 - 10.3 mg/dL   Total Protein 6.4 (L) 6.5 - 8.1 g/dL   Albumin 3.0 (L) 3.5 - 5.0 g/dL   AST 20 15 - 41 U/L   ALT 20 14 - 54 U/L   Alkaline Phosphatase 72 38 - 126 U/L   Total Bilirubin 0.5 0.3 - 1.2  mg/dL   GFR calc non Af Amer >60 >60 mL/min   GFR calc Af Amer >60 >60 mL/min    Comment: (NOTE) The eGFR has been calculated using the CKD EPI equation. This calculation has not been validated in all clinical situations. eGFR's persistently <60 mL/min signify possible Chronic Kidney Disease.    Anion gap 9 5 - 15       HPI :  Felicia King a 46 y.o.femalewith medical history significant for hypertension, chronic back pain, and hypothyroidism who presents to the emergency department with severe chest pain for the past 2-3 days. Patient reports that she had been in her usual state of health until she noted the insidious development of chest pain approximately 3 days ago. CTA study was performed and notable for multifocal lobar PE in bilateral upper and lower lobes with overall clot burden moderate to large, but no evidence for heart strain.   HOSPITAL COURSE:   Acute Multifocal lobar PE - Mod-Large Clot burden  -likely secondary to sedentary life style.  Patient placed on heparin drip from 5/21-5/24. Transitioned to Xarelto Hx of upper extremity clot from previous IV several years ago which was tx with coumadin therefore she is quit aware of dietary restriction and frequent INR checks.  Lower extremity Doppler Thrombus noted in an intramuscular vein of the left calf (soleal vein) 2-D echo with results as above.No right heart strain    HTN -home meds on hold, resume metoprolol in 2 weeks  Hypothyroidism  -cont synthroid , TSH within normal limits  Leukocytosis, resolved -no signs of infection, likely from PE Patient did have small pulmonary infarcts on CT  Anemia? Hemoglobin remained stable at around 11, anemia panel consistent with anemia of chronic disease    Discharge Exam:   Blood pressure 138/71, pulse 71, temperature 98.3 F (36.8 C), resp. rate (!) 22, height '5\' 2"'$  (1.575 m), weight 127 kg (280 lb), SpO2 97 %.  Cardiovascular system: S1 & S2 heard, RRR. No  JVD, murmurs, rubs, gallops or clicks. No pedal edema. Gastrointestinal system: Abdomen is nondistended, soft and nontender. No organomegaly or masses felt. Normal bowel sounds heard. Central nervous system: Alert and oriented. No focal neurological deficits. Extremities: Symmetric 5 x 5 power. Skin: No rashes, lesions or ulcers Psychiatry: Judgement and insight appear normal. Mood & affect appropriate.       SignedReyne Dumas 01/31/2017, 9:42 AM        Time spent >45 mins

## 2017-02-01 DIAGNOSIS — I2602 Saddle embolus of pulmonary artery with acute cor pulmonale: Secondary | ICD-10-CM

## 2017-02-01 LAB — HEPARIN LEVEL (UNFRACTIONATED)

## 2017-02-01 LAB — CBC
HCT: 36.2 % (ref 36.0–46.0)
HEMOGLOBIN: 10.9 g/dL — AB (ref 12.0–15.0)
MCH: 26 pg (ref 26.0–34.0)
MCHC: 30.1 g/dL (ref 30.0–36.0)
MCV: 86.2 fL (ref 78.0–100.0)
Platelets: 276 10*3/uL (ref 150–400)
RBC: 4.2 MIL/uL (ref 3.87–5.11)
RDW: 16.7 % — ABNORMAL HIGH (ref 11.5–15.5)
WBC: 6.4 10*3/uL (ref 4.0–10.5)

## 2017-02-01 MED ORDER — KETOROLAC TROMETHAMINE 30 MG/ML IJ SOLN
30.0000 mg | Freq: Once | INTRAMUSCULAR | Status: AC
  Administered 2017-02-01: 30 mg via INTRAVENOUS
  Filled 2017-02-01: qty 1

## 2017-02-01 MED ORDER — APIXABAN 5 MG PO TABS: 5.0000 mg | ORAL_TABLET | Freq: Two times a day (BID) | ORAL | 3 refills | Status: DC

## 2017-02-01 MED ORDER — APIXABAN 5 MG PO TABS: 5.0000 mg | ORAL_TABLET | Freq: Two times a day (BID) | ORAL | Status: DC

## 2017-02-01 MED ORDER — PANTOPRAZOLE SODIUM 40 MG PO TBEC: 40.0000 mg | DELAYED_RELEASE_TABLET | Freq: Every day | ORAL | 1 refills | Status: AC

## 2017-02-01 MED ORDER — APIXABAN 5 MG PO TABS: 10.0000 mg | ORAL_TABLET | Freq: Two times a day (BID) | ORAL | Status: DC

## 2017-02-01 MED ORDER — APIXABAN 5 MG PO TABS: 10.0000 mg | ORAL_TABLET | Freq: Two times a day (BID) | ORAL | 0 refills | Status: AC

## 2017-02-01 MED ORDER — HYDROCODONE-ACETAMINOPHEN 5-325 MG PO TABS: 1.0000 | ORAL_TABLET | Freq: Four times a day (QID) | ORAL | 0 refills | Status: AC | PRN

## 2017-02-01 MED ORDER — APIXABAN 5 MG PO TABS: 5.0000 mg | ORAL_TABLET | Freq: Two times a day (BID) | ORAL | 3 refills | Status: AC

## 2017-02-01 MED ORDER — APIXABAN 5 MG PO TABS: 10.0000 mg | ORAL_TABLET | Freq: Two times a day (BID) | ORAL | 0 refills | Status: DC

## 2017-02-01 NOTE — Evaluation (Signed)
Occupational Therapy Evaluation Patient Details Name: Felicia King MRN: [id number] DOB: 1971/06/25 Today's Date: 02/01/2017    History of Present Illness 46 yo admitted with PE and LLE DVT. PMHx: HTN, BP   Clinical Impression   Pt currently supervision to modified independent for most selfcare tasks.  Still exhibits decreased O2 sats with mobility and completion of toilet transfers down to 86% on room air with HR increasing to 130  BPM.  She still exhibits increased left chest pain as well as LLE pain as well with all movement.  Dyspnea 3/4 with rest breaks needed as well.  Pt has been educated on energy conservation strategies as well as AE/DME.  No further acute OT needs at this time.      Follow Up Recommendations  No OT follow up;Supervision - Intermittent    Equipment Recommendations  None recommended by OT       Precautions / Restrictions Precautions Precautions: Fall Precaution Comments: watch sats Restrictions Weight Bearing Restrictions: No      Mobility Bed Mobility Overal bed mobility: Needs Assistance Bed Mobility: Supine to Sit;Sit to Supine     Supine to sit: Modified independent (Device/Increase time) Sit to supine: Modified independent (Device/Increase time)   General bed mobility comments: Increased time and increased pain in the LLE and left chest with transition to sitting and when transferring to supine.  Transfers Overall transfer level: Modified independent Equipment used: Rolling walker (2 wheeled)             General transfer comment: cues for transfer safety only    Balance Overall balance assessment: No apparent balance deficits (not formally assessed)                                         ADL either performed or assessed with clinical judgement   ADL Overall ADL's : Needs assistance/impaired Eating/Feeding: Independent   Grooming: Wash/dry hands;Wash/dry face;Supervision/safety   Upper Body Bathing: Set  up;Sitting   Lower Body Bathing: Supervison/ safety;Sit to/from stand   Upper Body Dressing : Supervision/safety;Sitting   Lower Body Dressing: Supervision/safety;Sit to/from stand   Toilet Transfer: Supervision/safety;Ambulation;Regular Toilet   Toileting- Architect and Hygiene: Supervision/safety;Sit to/from stand   Tub/ Shower Transfer: Supervision/safety;Ambulation Tub/Shower Transfer Details (indicate cue type and reason): simulated Functional mobility during ADLs: Supervision/safety General ADL Comments: Pt with dyspnea 3/4 with minimal activity secondary to PE.  Pt's O2 sats decreased to 86% on room air with activity and ambulation without assistive device.  Educated pt on AE availability for LB selfcare as well as energy conservation strategies for selfcare and kitchen tasks.  Will benefit from shower seat at home and she reports that she will have access to one at her mother-in-laws when they move there within a few days.  No further OT needs at this time.       Vision Baseline Vision/History: Wears glasses Wears Glasses: Reading only Patient Visual Report: No change from baseline Vision Assessment?: No apparent visual deficits            Pertinent Vitals/Pain Pain Assessment: 0-10 Pain Score: 6  Faces Pain Scale: Hurts little more Pain Location: chest and leg Pain Descriptors / Indicators: Grimacing;Sore Pain Intervention(s): Monitored during session     Hand Dominance Right   Extremity/Trunk Assessment Upper Extremity Assessment Upper Extremity Assessment: LUE deficits/detail LUE Deficits / Details: AROM shoulder flexion0-150 degrees with increased pain  in the left chest region with raising the arm.  Strength 3+/5.  All other joints AROM WFLS with strength 4/5 throughout.  LUE Sensation: decreased light touch (Pt reports numbness in the LUE and hand at times)   Lower Extremity Assessment Lower Extremity Assessment: Defer to PT evaluation   Cervical  / Trunk Assessment Cervical / Trunk Assessment: Normal   Communication Communication Communication: No difficulties   Cognition Arousal/Alertness: Awake/alert Behavior During Therapy: WFL for tasks assessed/performed Overall Cognitive Status: Within Functional Limits for tasks assessed                                     General Comments  Discuss expected progression of activity.  Lifting to 2-5 lbs, no strenuous activity, but need to not stay sedentary            Home Living Family/patient expects to be discharged to:: Private residence Living Arrangements: Spouse/significant other;Children Available Help at Discharge: Family;Available PRN/intermittently Type of Home: House Home Access: Level entry     Home Layout: Two level;Bed/bath upstairs;1/2 bath on main level     Bathroom Shower/Tub: Chief Strategy OfficerTub/shower unit   Bathroom Toilet: Standard     Home Equipment: None          Prior Functioning/Environment Level of Independence: Independent        Comments: pt lives with fiance and 618 yo son. works from home part-time                 OT Goals(Current goals can be found in the care plan section) Acute Rehab OT Goals Patient Stated Goal: return home  OT Frequency:                AM-PAC PT "6 Clicks" Daily Activity     Outcome Measure Help from another person eating meals?: None Help from another person taking care of personal grooming?: None Help from another person toileting, which includes using toliet, bedpan, or urinal?: None Help from another person bathing (including washing, rinsing, drying)?: None Help from another person to put on and taking off regular upper body clothing?: None Help from another person to put on and taking off regular lower body clothing?: None 6 Click Score: 24   End of Session Equipment Utilized During Treatment: Rolling walker Nurse Communication: Mobility status  Activity Tolerance: Patient tolerated treatment  well Patient left: in bed;with call bell/phone within reach  OT Visit Diagnosis: Muscle weakness (generalized) (M62.81);Pain Pain - Right/Left: Left Pain - part of body: Leg                Time: 1350-1430 OT Time Calculation (min): 40 min Charges:  OT General Charges $OT Visit: 1 Procedure OT Evaluation $OT Eval Moderate Complexity: 1 Procedure OT Treatments $Self Care/Home Management : 23-37 mins      Kaladin Noseworthy OTR/L 02/01/2017, 2:41 PM

## 2017-02-01 NOTE — Care Management Note (Signed)
Case Management Note  Patient Details  Name: Felicia StallWendy H Warriner MRN: [id number] Date of Birth: [birth year]  Subjective/Objective:         Admitted with  Acute pulmonary embolism.  Resides with fiance' and son.         PCP; Eulis ManlyMathews Philip  Action/Plan: Plan is to d/c to home when medically stable with home health services (PT) and home oxygen.   Expected Discharge Date:                  Expected Discharge Plan:  Home w Home Health Services  In-House Referral:     Discharge planning Services  CM Consult  Post Acute Care Choice:    Choice offered to:  Patient  DME Arranged:  Oxygen DME Agency:  [facility] referral made with Lupita LeashDonna ,[phone]  [facility]  [facility] Agency:  [facility],referral made with Lupita LeashDonna, [phone]   Status of Service:  Completed, signed off  If discussed at Long Length of Stay Meetings, dates discussed:    Additional Comments:  Epifanio LeschesCole, Lewellyn Fultz Hudson, RN 02/01/2017, 1:09 PM

## 2017-02-01 NOTE — Progress Notes (Signed)
CM provided pt with Xarelto free 30 day card. Pt's copay will be 20% of the cash price for Xarelto. Cash price for Xarelto : [facility] - $ 501.25 [facility] - $ 505.00 [facility] - $ 472.50 CM made pt aware and pt stated she can't afford the copay cost (20% of cash price). CM made MD aware. Gae GallopAngela Gwenn Teodoro RN, BSN,CM

## 2017-02-01 NOTE — Progress Notes (Signed)
SATURATION QUALIFICATIONS: (This note is used to comply with regulatory documentation for home oxygen)  Patient Saturations on Room Air at Rest = 95%  Patient Saturations on Room Air while Ambulating = 82%  Patient Saturations on 3 Liters of oxygen while Ambulating = 92%  Please briefly explain why patient needs home oxygen: 

## 2017-02-01 NOTE — Progress Notes (Signed)
Physical Therapy Treatment Patient Details Name: Felicia King MRN: [id number] DOB: 1970/11/23 Today's Date: 02/01/2017    History of Present Illness 46 yo admitted with PE and LLE DVT. PMHx: HTN, BP    PT Comments    Emphasis on gait and stairs on 3L O2, discussion on progression of activity and need to clarify some of the imposed limitations.  Qualification note supports need for Oxygen.   Follow Up Recommendations  Home health PT     Equipment Recommendations  Rolling walker with 5" wheels    Recommendations for Other Services       Precautions / Restrictions Precautions Precautions: Fall Precaution Comments: watch sats Restrictions Weight Bearing Restrictions: No    Mobility  Bed Mobility                  Transfers Overall transfer level: Modified independent               General transfer comment: cues for transfer safety only  Ambulation/Gait Ambulation/Gait assistance: Supervision Ambulation Distance (Feet): 200 Feet Assistive device: Rolling walker (2 wheeled) Gait Pattern/deviations: Step-through pattern   Gait velocity interpretation: at or above normal speed for age/gender General Gait Details: able to speed up, but prefers slow.  Sats on 3L 95% and EHR 92 bpm.  Qualification note already supports need for oxygen.   Stairs Stairs: Yes   Stair Management: One rail Right;Step to pattern;Forwards Number of Stairs: 3 General stair comments: safe with minor pain stepping down  Wheelchair Mobility    Modified Rankin (Stroke Patients Only)       Balance Overall balance assessment: No apparent balance deficits (not formally assessed)                                          Cognition Arousal/Alertness: Awake/alert Behavior During Therapy: WFL for tasks assessed/performed Overall Cognitive Status: Within Functional Limits for tasks assessed                                        Exercises      General Comments General comments (skin integrity, edema, etc.): Discuss expected progression of activity.  Lifting to 2-5 lbs, no strenuous activity, but need to not stay sedentary      Pertinent Vitals/Pain Pain Assessment: Faces Faces Pain Scale: Hurts little more Pain Location: left leg Pain Descriptors / Indicators: Grimacing;Sore Pain Intervention(s): Monitored during session    Home Living                      Prior Function            PT Goals (current goals can now be found in the care plan section) Acute Rehab PT Goals Patient Stated Goal: return home PT Goal Formulation: With patient Time For Goal Achievement: 02/07/17 Potential to Achieve Goals: Good Progress towards PT goals: Progressing toward goals    Frequency    Min 3X/week      PT Plan Current plan remains appropriate    Co-evaluation              AM-PAC PT "6 Clicks" Daily Activity  Outcome Measure  Difficulty turning over in bed (including adjusting bedclothes, sheets and blankets)?: A Little Difficulty moving from lying on back to sitting on  the side of the bed? : A Lot Difficulty sitting down on and standing up from a chair with arms (e.g., wheelchair, bedside commode, etc,.)?: A Little Help needed moving to and from a bed to chair (including a wheelchair)?: A Little Help needed walking in hospital room?: A Little Help needed climbing 3-5 steps with a railing? : A Little 6 Click Score: 17    End of Session Equipment Utilized During Treatment: Oxygen Activity Tolerance: Patient tolerated treatment well Patient left: in chair;with call bell/phone within reach Nurse Communication: Mobility status PT Visit Diagnosis: Other abnormalities of gait and mobility (R26.89);Difficulty in walking, not elsewhere classified (R26.2)     Time: 1230-1301 PT Time Calculation (min) (ACUTE ONLY): 31 min  Charges:  $Gait Training: 8-22 mins $Therapeutic Activity: 8-22 mins                     G Codes:       February 15, 2017   Bing, PT [phone]  (pager)   Eliseo Gum Josede Cicero February 15, 2017, 1:10 PM

## 2017-02-01 NOTE — Discharge Summary (Signed)
Physician Discharge Summary  Felicia King MRN: [id number] DOB/AGE: 09/24/1970 46 y.o.  PCP: Marijo File, MD   Admit date: 01/28/2017 Discharge date: 02/01/2017  Discharge Diagnoses:    Principal Problem:   Acute pulmonary embolism [facility]) Active Problems:   Essential hypertension   Hypothyroidism   Chest pain     Follow-up recommendations Follow-up with PCP in 3-5 days , including all  additional recommended appointments as below Follow-up CBC, CMP in 3-5 days PATIENT DISCHARGED ON 3L OXYGEN THAT NEEDS TO BE WEANED SLOWLY OUTPATIENT       Current Discharge Medication List    START taking these medications   Details  !! apixaban (ELIQUIS) 5 MG TABS tablet Take 2 tablets (10 mg total) by mouth 2 (two) times daily. Qty: 20 tablet, Refills: 0    !! apixaban (ELIQUIS) 5 MG TABS tablet Take 1 tablet (5 mg total) by mouth 2 (two) times daily. Qty: 60 tablet, Refills: 3    HYDROcodone-acetaminophen (NORCO/VICODIN) 5-325 MG tablet Take 1 tablet by mouth every 6 (six) hours as needed for moderate pain. Qty: 30 tablet, Refills: 0    pantoprazole (PROTONIX) 40 MG tablet Take 1 tablet (40 mg total) by mouth daily. Qty: 30 tablet, Refills: 1    polyethylene glycol (MIRALAX / GLYCOLAX) packet Take 17 g by mouth daily as needed for mild constipation. Qty: 14 each, Refills: 0     !! - Potential duplicate medications found. Please discuss with provider.    CONTINUE these medications which have CHANGED   Details  metoprolol succinate (TOPROL-XL) 50 MG 24 hr tablet Take 1 tablet (50 mg total) by mouth daily. For high blood pressure Qty: 30 tablet, Refills: 0      CONTINUE these medications which have NOT CHANGED   Details  aspirin EC 81 MG tablet Take 81-162 mg by mouth See admin instructions. Take 1 tablet (81 mg) by mouth every morning, may take 2 tablets (162) if in pain when waking up    atorvastatin (LIPITOR) 40 MG tablet Take 40 mg by mouth at bedtime. For high  cholesterol    baclofen (LIORESAL) 20 MG tablet Take 20 mg by mouth 3 (three) times daily as needed for muscle spasms.    busPIRone (BUSPAR) 5 MG tablet Take 5 mg by mouth 2 (two) times daily. For anxiety    cholecalciferol (VITAMIN D) 1000 units tablet Take 1,000 Units by mouth daily.    Diclofenac Sodium 1.5 % SOLN Place 10 drops onto the skin 2 (two) times daily as needed (neck, shoulders and chest pain).    dicyclomine (BENTYL) 10 MG capsule Take 10 mg by mouth 3 (three) times daily before meals.    DULoxetine (CYMBALTA) 60 MG capsule Take 60 mg by mouth at bedtime. For depression    fluticasone (FLONASE) 50 MCG/ACT nasal spray Place 1 spray into both nostrils at bedtime as needed for allergies or rhinitis.     gabapentin (NEURONTIN) 300 MG capsule Take 300 mg by mouth 3 (three) times daily as needed (nerve pain).     levothyroxine (SYNTHROID, LEVOTHROID) 200 MCG tablet Take 200 mcg by mouth daily.    mirabegron ER (MYRBETRIQ) 50 MG TB24 tablet Take 50 mg by mouth at bedtime. For bladder incontinence    Multiple Vitamin (MULTIVITAMIN WITH MINERALS) TABS tablet Take 1 tablet by mouth daily. Daily Women's Energy Vitamin    omeprazole (PRILOSEC) 20 MG capsule Take 20 mg by mouth at bedtime. For stomach acid    pramipexole (  MIRAPEX) 1.5 MG tablet Take 0.75 mg by mouth See admin instructions. Take 1/2 tablet (0.75 mg) by mouth every evening (4p-5p) and at bedtime - for restless legs    SUMAtriptan (IMITREX) 50 MG tablet Take 50 mg by mouth every 2 (two) hours as needed for migraine. May repeat in 2 hours if headache persists or recurs - max 3 tablets per week    vitamin C (ASCORBIC ACID) 500 MG tablet Take 500 mg by mouth daily.         Discharge Condition: Stable   Discharge Instructions Get Medicines reviewed and adjusted: Please take all your medications with you for your next visit with your Primary MD  Please request your Primary MD to go over all hospital tests and  procedure/radiological results at the follow up, please ask your Primary MD to get all Hospital records sent to his/her office.  If you experience worsening of your admission symptoms, develop shortness of breath, life threatening emergency, suicidal or homicidal thoughts you must seek medical attention immediately by calling 911 or calling your MD immediately if symptoms less severe.  You must read complete instructions/literature along with all the possible adverse reactions/side effects for all the Medicines you take and that have been prescribed to you. Take any new Medicines after you have completely understood and accpet all the possible adverse reactions/side effects.   Do not drive when taking Pain medications.   Do not take more than prescribed Pain, Sleep and Anxiety Medications  Special Instructions: If you have smoked or chewed Tobacco in the last 2 yrs please stop smoking, stop any regular Alcohol and or any Recreational drug use.  Wear Seat belts while driving.  Please note  You were cared for by a hospitalist during your hospital stay. Once you are discharged, your primary care physician will handle any further medical issues. Please note that NO REFILLS for any discharge medications will be authorized once you are discharged, as it is imperative that you return to your primary care physician (or establish a relationship with a primary care physician if you do not have one) for your aftercare needs so that they can reassess your need for medications and monitor your lab values.  Discharge Instructions    Diet - low sodium heart healthy    Complete by:  As directed    Increase activity slowly    Complete by:  As directed        Allergies  Allergen Reactions  . Morphine And Related Itching  . Sulfa Antibiotics Anaphylaxis      Disposition:    Consults:  None    Significant Diagnostic Studies:  Dg Chest 2 View  Result Date: 01/28/2017 CLINICAL DATA:   Nausea, vomiting, and diarrhea for 1 week, LEFT side chest pain for 2 days, LEFT neck pain and head pain, worsening chest pain worsened when lying flat, history hypertension EXAM: CHEST  2 VIEW COMPARISON:  04/06/2006 FINDINGS: Borderline enlargement of cardiac silhouette. Mediastinal contours and pulmonary vascularity normal. Decreased lung volumes with bibasilar atelectasis. Lungs otherwise clear. No pleural effusion or pneumothorax. Scattered degenerative disc disease changes of the thoracic spine. IMPRESSION: Low lung volumes with bibasilar atelectasis. Electronically Signed   By: Lavonia Dana M.D.   On: 01/28/2017 16:32   Ct Angio Chest Pe W Or Wo Contrast  Result Date: 01/28/2017 CLINICAL DATA:  Chest pain, shortness of breath x2 days EXAM: CT ANGIOGRAPHY CHEST WITH CONTRAST TECHNIQUE: Multidetector CT imaging of the chest was performed using the  standard protocol during bolus administration of intravenous contrast. Multiplanar CT image reconstructions and MIPs were obtained to evaluate the vascular anatomy. CONTRAST:  100 mL Isovue 370 IV COMPARISON:  Chest radiographs dated 01/28/2017 FINDINGS: Cardiovascular: Satisfactory opacification of the bilateral pulmonary arteries to the segmental level. Multiple lobar pulmonary emboli in the distal right main pulmonary artery (series 5/ image 87), extending into the upper and lower lobes, as well as the bifurcation of the left upper and lower pulmonary arteries (series 5/ image 45), and extending into the bilateral lower lobes (series 5/ images 59-60). Overall clot burden is moderate to large. RV to LV ratio 0.97.  No evidence of right heart strain. The heart is normal in size.  No pericardial effusion. No evidence of thoracic aortic aneurysm or dissection. Mediastinum/Nodes: No suspicious mediastinal lymphadenopathy. Lungs/Pleura: Multifocal subpleural patchy opacities in the posterior right middle lobe (series 7/image 67, lingula (series 7/image 32), and left  lower lobe (series 7/image 36). In this clinical setting, this appearance is worrisome for early/ developing pulmonary infarcts. Additional linear scarring/ atelectasis in the left upper lobe. Mild patchy left lower lobe opacity, likely atelectasis. Trace left pleural effusion. No pneumothorax. Upper Abdomen: Visualized upper abdomen is notable for cholecystectomy clips. Musculoskeletal: Degenerative changes of the visualized thoracolumbar spine. Review of the MIP images confirms the above findings. IMPRESSION: Multifocal lobar pulmonary emboli in the bilateral upper and lower lobes, as described above. Overall clot burden is moderate to large. No evidence of right heart strain. Multifocal patchy subpleural opacities in the right middle lobe, lingula and bilateral lower lobes, worrisome for early developing pulmonary infarcts. Trace left pleural effusion. Critical Value/emergent results were called by telephone at the time of interpretation on 01/28/2017 at 5:56 pm to Dr. Lennice Sites, who verbally acknowledged these results. Electronically Signed   By: Julian Hy M.D.   On: 01/28/2017 17:58    echocardiogram  LV EF: 50% -   55%  ------------------------------------------------------------------- Indications:      Pulmonary embolus 415.19.  ------------------------------------------------------------------- History:   PMH:  No prior cardiac history.  ------------------------------------------------------------------- Study Conclusions  - Left ventricle: The cavity size was normal. There was moderate   concentric hypertrophy. Systolic function was normal. The   estimated ejection fraction was in the range of 50% to 55%. Wall   motion was normal; there were no regional wall motion   abnormalities. Left ventricular diastolic function parameters   were normal. - Aortic valve: There was no regurgitation. - Mitral valve: Transvalvular velocity was within the normal range.   There was no  evidence for stenosis. There was no regurgitation.   Valve area by pressure half-time: 0.97 cm^2. - Right ventricle: The cavity size was normal. Wall thickness was   normal. Systolic function was normal. - Tricuspid valve: There was no regurgitation.      Filed Weights   01/28/17 1800 01/30/17 1106  Weight: 133.8 kg (295 lb) 127 kg (280 lb)     Microbiology: Recent Results (from the past 240 hour(s))  MRSA PCR Screening     Status: None   Collection Time: 01/28/17  8:09 PM  Result Value Ref Range Status   MRSA by PCR NEGATIVE NEGATIVE Final    Comment:        The GeneXpert MRSA Assay (FDA approved for NASAL specimens only), is one component of a comprehensive MRSA colonization surveillance program. It is not intended to diagnose MRSA infection nor to guide or monitor treatment for MRSA infections.  Blood Culture No results found for: SDES, SPECREQUEST, CULT, REPTSTATUS    Labs: Results for orders placed or performed during the hospital encounter of 01/28/17 (from the past 48 hour(s))  CBC     Status: Abnormal   Collection Time: 01/31/17  5:38 AM  Result Value Ref Range   WBC 8.4 4.0 - 10.5 K/uL   RBC 4.24 3.87 - 5.11 MIL/uL   Hemoglobin 11.3 (L) 12.0 - 15.0 g/dL   HCT 36.3 36.0 - 46.0 %   MCV 85.6 78.0 - 100.0 fL   MCH 26.7 26.0 - 34.0 pg   MCHC 31.1 30.0 - 36.0 g/dL   RDW 16.5 (H) 11.5 - 15.5 %   Platelets 232 150 - 400 K/uL  Heparin level (unfractionated)     Status: None   Collection Time: 01/31/17  5:38 AM  Result Value Ref Range   Heparin Unfractionated 0.43 0.30 - 0.70 IU/mL    Comment:        IF HEPARIN RESULTS ARE BELOW EXPECTED VALUES, AND PATIENT DOSAGE HAS BEEN CONFIRMED, SUGGEST FOLLOW UP TESTING OF ANTITHROMBIN III LEVELS.   Comprehensive metabolic panel     Status: Abnormal   Collection Time: 01/31/17  5:38 AM  Result Value Ref Range   Sodium 139 135 - 145 mmol/L   Potassium 3.8 3.5 - 5.1 mmol/L   Chloride 103 101 - 111 mmol/L    CO2 27 22 - 32 mmol/L   Glucose, Bld 92 65 - 99 mg/dL   BUN 8 6 - 20 mg/dL   Creatinine, Ser 0.79 0.44 - 1.00 mg/dL   Calcium 8.8 (L) 8.9 - 10.3 mg/dL   Total Protein 6.4 (L) 6.5 - 8.1 g/dL   Albumin 3.0 (L) 3.5 - 5.0 g/dL   AST 20 15 - 41 U/L   ALT 20 14 - 54 U/L   Alkaline Phosphatase 72 38 - 126 U/L   Total Bilirubin 0.5 0.3 - 1.2 mg/dL   GFR calc non Af Amer >60 >60 mL/min   GFR calc Af Amer >60 >60 mL/min    Comment: (NOTE) The eGFR has been calculated using the CKD EPI equation. This calculation has not been validated in all clinical situations. eGFR's persistently <60 mL/min signify possible Chronic Kidney Disease.    Anion gap 9 5 - 15  CBC     Status: Abnormal   Collection Time: 02/01/17  4:45 AM  Result Value Ref Range   WBC 6.4 4.0 - 10.5 K/uL   RBC 4.20 3.87 - 5.11 MIL/uL   Hemoglobin 10.9 (L) 12.0 - 15.0 g/dL   HCT 36.2 36.0 - 46.0 %   MCV 86.2 78.0 - 100.0 fL   MCH 26.0 26.0 - 34.0 pg   MCHC 30.1 30.0 - 36.0 g/dL   RDW 16.7 (H) 11.5 - 15.5 %   Platelets 276 150 - 400 K/uL  Heparin level (unfractionated)     Status: Abnormal   Collection Time: 02/01/17  4:45 AM  Result Value Ref Range   Heparin Unfractionated >2.20 (H) 0.30 - 0.70 IU/mL    Comment: RESULTS CONFIRMED BY MANUAL DILUTION        IF HEPARIN RESULTS ARE BELOW EXPECTED VALUES, AND PATIENT DOSAGE HAS BEEN CONFIRMED, SUGGEST FOLLOW UP TESTING OF ANTITHROMBIN III LEVELS.        HPI :  Felicia King a 46 y.o.femalewith medical history significant for hypertension, chronic back pain, and hypothyroidism who presents to the emergency department with severe chest pain for the  past 2-3 days. Patient reports that she had been in her usual state of health until she noted the insidious development of chest pain approximately 3 days ago. CTA study was performed and notable for multifocal lobar PE in bilateral upper and lower lobes with overall clot burden moderate to large, but no evidence for heart  strain.   HOSPITAL COURSE:   Acute Multifocal lobar PE - Mod-Large Clot burden  -likely secondary to sedentary life style.  Patient placed on heparin drip from 5/21-5/24. 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  Lower extremity Doppler Thrombus noted in an intramuscular vein of the left calf (soleal vein) 2-D echo with results as above.No right heart strain    HTN -home meds on hold, resume metoprolol in 2 weeks  Hypothyroidism  -cont synthroid , TSH within normal limits  Leukocytosis, resolved -no signs of infection, likely from PE Patient did have small pulmonary infarcts on CT  Anemia? Hemoglobin remained stable at around 11, anemia panel consistent with anemia of chronic disease    Discharge Exam:   Blood pressure 127/62, pulse 72, temperature 98.8 F (37.1 C), temperature source Oral, resp. rate 18, height '5\' 2"'$  (1.575 m), weight 127 kg (280 lb), SpO2 (!) 86 %.  Cardiovascular system: S1 & S2 heard, RRR. No JVD, murmurs, rubs, gallops or clicks. No pedal edema. Gastrointestinal system: Abdomen is nondistended, soft and nontender. No organomegaly or masses felt. Normal bowel sounds heard. Central nervous system: Alert and oriented. No focal neurological deficits. Extremities: Symmetric 5 x 5 power. Skin: No rashes, lesions or ulcers Psychiatry: Judgement and insight appear normal. Mood & affect appropriate.     Follow-up Information    Marijo File, MD. Call.   Specialty:  Family Medicine Why:  Hospital follow-up in 3-5 days Contact information: [address] [place] [address] [phone]           Signed: Reyne Dumas 02/01/2017, 3:23 PM        Time spent >45 mins

## 2017-02-01 NOTE — Progress Notes (Signed)
Benefits check Eliquis:   ELIQUIS 10 MG -NO   1.ELIQUIS 2.5 MG BIC   COVER- YES  CO-PAY- 0.86 CENT  TIER- 3 DRUG  PRIOR APPROVAL- NO   2. ELIQUIS 5 MG BID   COVER- YES  CO-PAY- 0.86 CENT  TIER- 3 DRUG  PRIOR APPROVAL- NO    PHARMACY : [facility]   Previous Messages    ----- Message -----  From: Jacinta Shoeole, Kendrew Paci H, RN  Sent: 02/01/2017  1:30 PM  To: [facility] Ccm Case Mgr Assistant  Subject: benefits check                  Eliguis 10mg  twice daily x 7 days, then 5mg  twice daily, please check copay. Thanks      CM made MD and pt aware. CM to provide pt with  30 free  Eliquis card. Gae GallopAngela Dorismar Chay RN,BSN,CM

## 2018-09-24 IMAGING — CT CT ANGIO CHEST
3 of 7 series · 17 of 36 positions shown · IV contrast (Omni 300)
Comparison: Chest radiographs dated 01/28/2017

CLINICAL DATA: Chest pain, shortness of breath x2 days

EXAM:
CT ANGIOGRAPHY CHEST WITH CONTRAST
TECHNIQUE: Multidetector CT imaging of the chest was performed using the
standard protocol during bolus administration of intravenous
contrast. Multiplanar CT image reconstructions and MIPs were
obtained to evaluate the vascular anatomy.
CONTRAST:  100 mL Isovue 370 IV

[Series 6: pe thins · axial · 0.83mm/px · z∈[+1025,+1289]mm · 12 of 314 slices shown]
[im 25/314  lung]
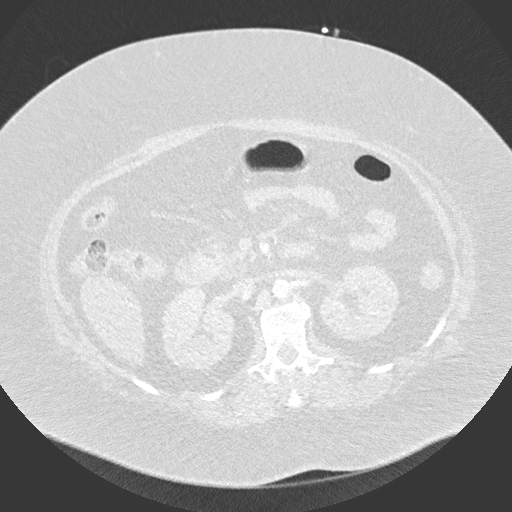
[im 49/314  mediastinal]
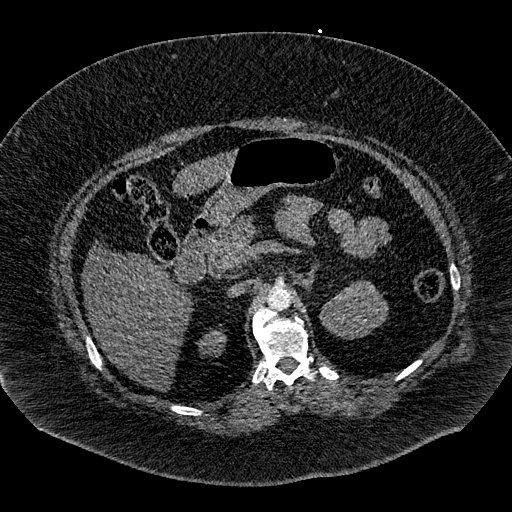
[im 73/314  lung]
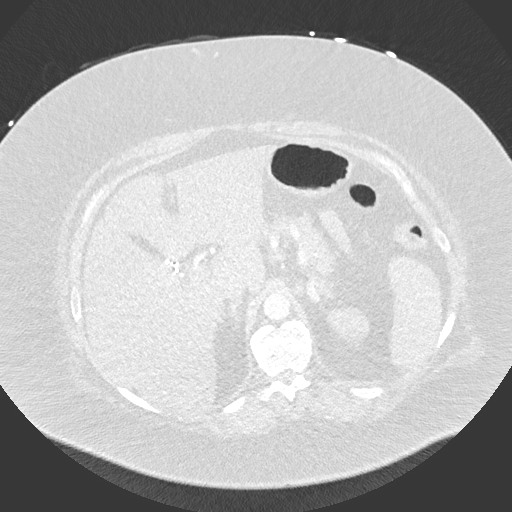
[im 97/314  mediastinal]
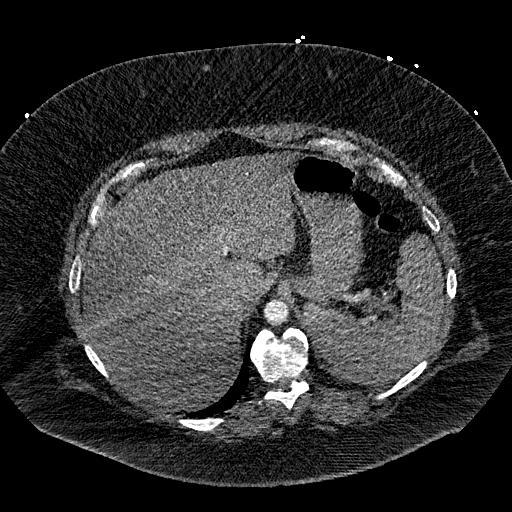
[im 121/314  lung]
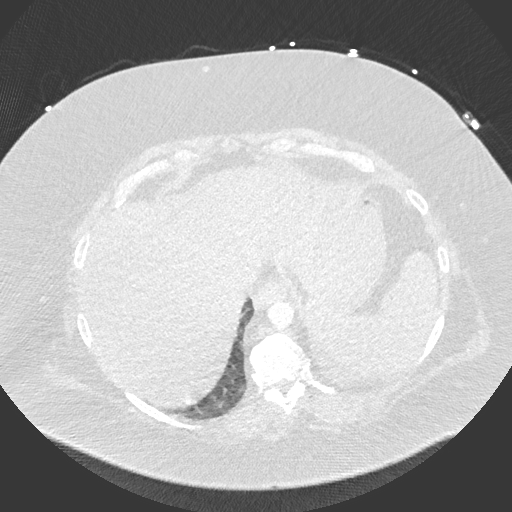
[im 145/314  mediastinal]
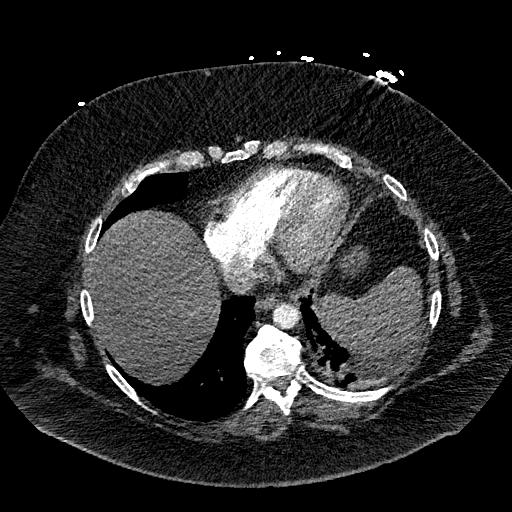
[im 169/314  lung]
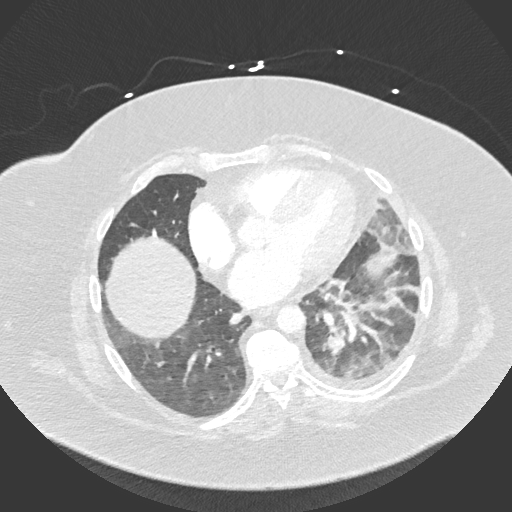
[im 193/314  mediastinal]
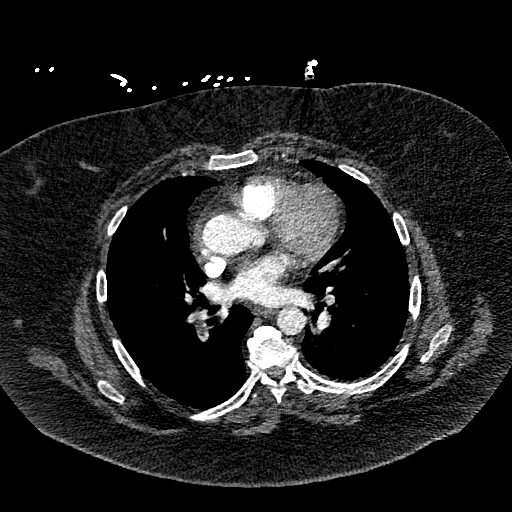
[im 217/314  lung]
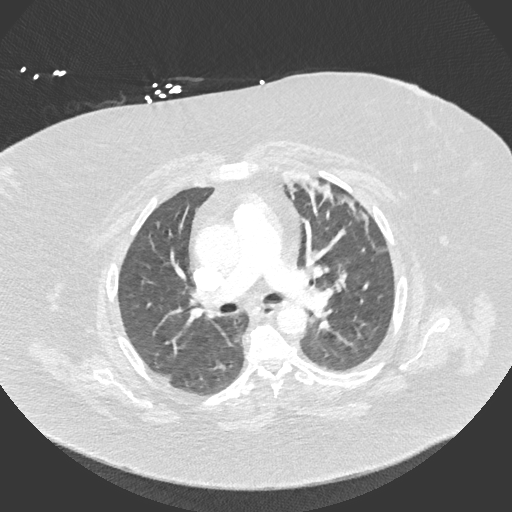
[im 241/314  mediastinal]
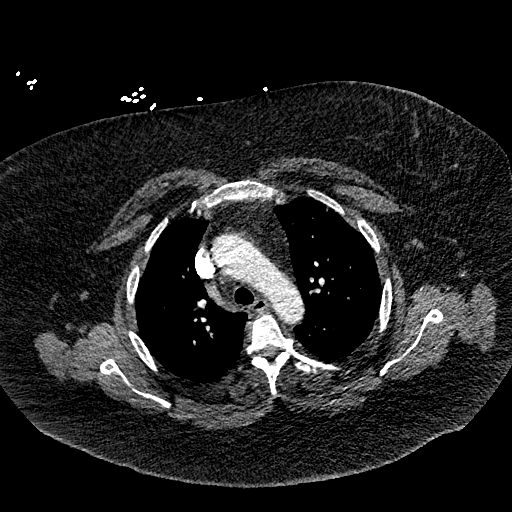
[im 265/314  lung]
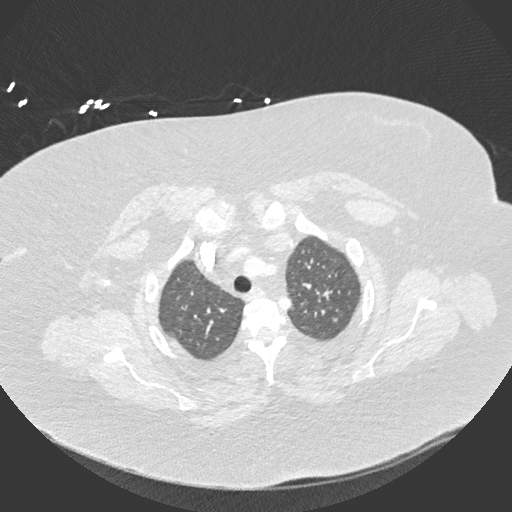
[im 289/314  mediastinal]
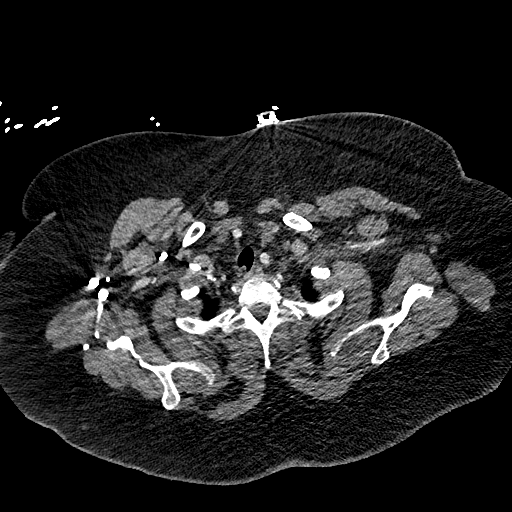

[Series 7: pe lung · axial · 0.83mm/px · z∈[+1096,+1258]mm · 4 of 137 slices shown]
[im 28/137  mediastinal]
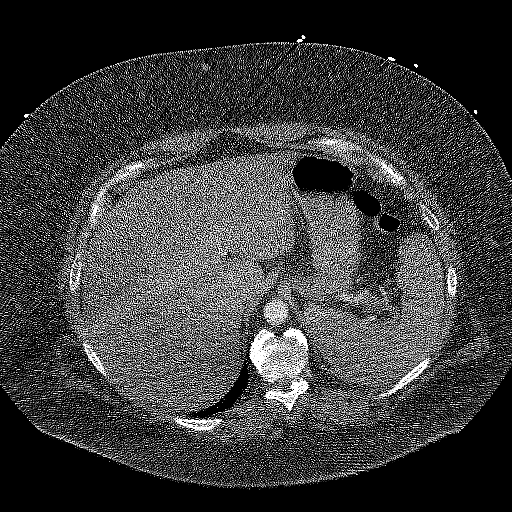
[im 55/137  mediastinal]
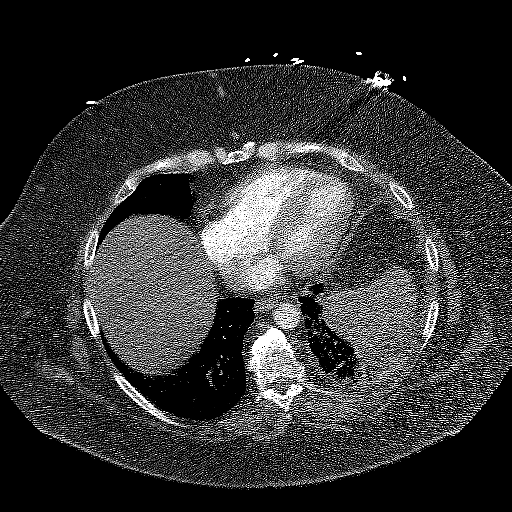
[im 82/137  mediastinal]
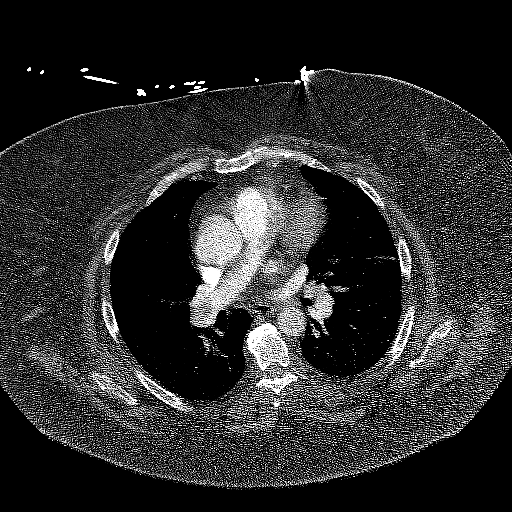
[im 109/137  mediastinal]
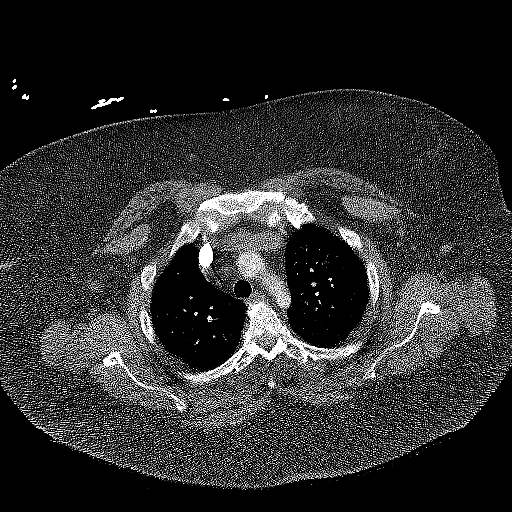

[Series 8: pe 2mm cor · coronal · 0.62mm/px · 1 of 158 slices shown]
[im 79/158  mediastinal]
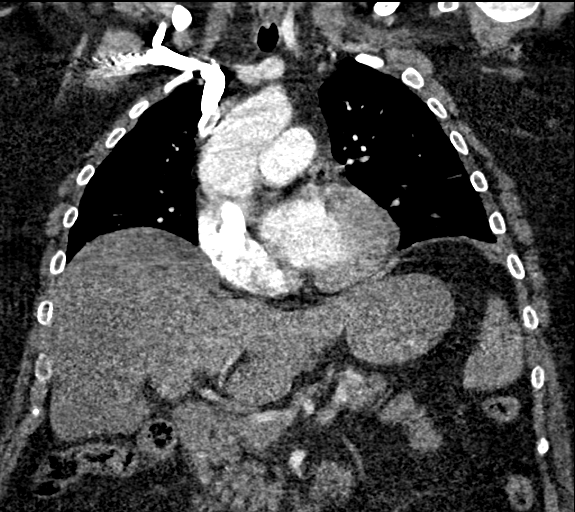

[17 of 36 positions shown; findings below may reference images not displayed]

FINDINGS: Cardiovascular: Satisfactory opacification of the bilateral
pulmonary arteries to the segmental level.

Multiple lobar pulmonary emboli in the distal right main pulmonary
artery (series 5/ image 87), extending into the upper and lower
lobes, as well as the bifurcation of the left upper and lower
pulmonary arteries (series 5/ image 45), and extending into the
bilateral lower lobes (series 5/ images 59-60). Overall clot burden
is moderate to large.

RV to LV ratio 0.97.  No evidence of right heart strain.

The heart is normal in size.  No pericardial effusion.

No evidence of thoracic aortic aneurysm or dissection.

Mediastinum/Nodes: No suspicious mediastinal lymphadenopathy.

Lungs/Pleura: Multifocal subpleural patchy opacities in the
posterior right middle lobe (series 7/image 67, lingula (series
7/image 32), and left lower lobe (series 7/image 36). In this
clinical setting, this appearance is worrisome for early/ developing
pulmonary infarcts.

Additional linear scarring/ atelectasis in the left upper lobe.

Mild patchy left lower lobe opacity, likely atelectasis.

Trace left pleural effusion.

No pneumothorax.

Upper Abdomen: Visualized upper abdomen is notable for
cholecystectomy clips.

Musculoskeletal: Degenerative changes of the visualized
thoracolumbar spine.

Review of the MIP images confirms the above findings.
IMPRESSION: Multifocal lobar pulmonary emboli in the bilateral upper and lower
lobes, as described above. Overall clot burden is moderate to large.
No evidence of right heart strain.

Multifocal patchy subpleural opacities in the right middle lobe,
lingula and bilateral lower lobes, worrisome for early developing
pulmonary infarcts.

Trace left pleural effusion.

Critical Value/emergent results were called by telephone at the time
of interpretation on 01/28/2017 at [DATE] to Dr. SALUMEH STACH, who
verbally acknowledged these results.
